# Patient Record
Sex: Female | Born: 1945
Health system: Southern US, Community
[De-identification: ages and names within clinical notes are randomized; demographics above are authoritative.]

## PROBLEM LIST (undated history)

## (undated) DIAGNOSIS — M419 Scoliosis, unspecified: Secondary | ICD-10-CM

## (undated) DIAGNOSIS — E785 Hyperlipidemia, unspecified: Secondary | ICD-10-CM

## (undated) DIAGNOSIS — J302 Other seasonal allergic rhinitis: Secondary | ICD-10-CM

## (undated) HISTORY — PX: TONSILLECTOMY: SUR1361

## (undated) HISTORY — DX: Other seasonal allergic rhinitis: J30.2

## (undated) HISTORY — DX: Hyperlipidemia, unspecified: E78.5

## (undated) HISTORY — PX: BREAST BIOPSY: SHX20

## (undated) HISTORY — DX: Scoliosis, unspecified: M41.9

---

## 1971-10-22 HISTORY — PX: NASAL SEPTUM SURGERY: SHX37

## 1978-10-21 HISTORY — PX: DILATION AND CURETTAGE OF UTERUS: SHX78

## 1998-01-12 ENCOUNTER — Ambulatory Visit (HOSPITAL_COMMUNITY): Admission: RE | Admit: 1998-01-12 | Discharge: 1998-01-12 | Payer: Self-pay | Admitting: *Deleted

## 1998-08-20 ENCOUNTER — Emergency Department (HOSPITAL_COMMUNITY): Admission: EM | Admit: 1998-08-20 | Discharge: 1998-08-21 | Payer: Self-pay | Admitting: Emergency Medicine

## 1998-08-20 ENCOUNTER — Encounter: Payer: Self-pay | Admitting: Emergency Medicine

## 1999-01-05 ENCOUNTER — Ambulatory Visit (HOSPITAL_COMMUNITY): Admission: RE | Admit: 1999-01-05 | Discharge: 1999-01-05 | Payer: Self-pay | Admitting: *Deleted

## 1999-01-05 ENCOUNTER — Encounter: Payer: Self-pay | Admitting: *Deleted

## 1999-03-08 ENCOUNTER — Ambulatory Visit: Admission: RE | Admit: 1999-03-08 | Discharge: 1999-03-08 | Payer: Self-pay | Admitting: *Deleted

## 1999-06-29 ENCOUNTER — Ambulatory Visit (HOSPITAL_COMMUNITY): Admission: RE | Admit: 1999-06-29 | Discharge: 1999-06-29 | Payer: Self-pay | Admitting: *Deleted

## 2000-03-07 ENCOUNTER — Encounter: Admission: RE | Admit: 2000-03-07 | Discharge: 2000-03-07 | Payer: Self-pay | Admitting: *Deleted

## 2000-03-07 ENCOUNTER — Encounter: Payer: Self-pay | Admitting: *Deleted

## 2000-11-07 ENCOUNTER — Encounter: Payer: Self-pay | Admitting: Emergency Medicine

## 2000-11-07 ENCOUNTER — Emergency Department (HOSPITAL_COMMUNITY): Admission: EM | Admit: 2000-11-07 | Discharge: 2000-11-07 | Payer: Self-pay | Admitting: *Deleted

## 2001-03-13 ENCOUNTER — Encounter: Payer: Self-pay | Admitting: *Deleted

## 2001-03-13 ENCOUNTER — Ambulatory Visit (HOSPITAL_COMMUNITY): Admission: RE | Admit: 2001-03-13 | Discharge: 2001-03-13 | Payer: Self-pay | Admitting: *Deleted

## 2001-04-17 ENCOUNTER — Other Ambulatory Visit: Admission: RE | Admit: 2001-04-17 | Discharge: 2001-04-17 | Payer: Self-pay | Admitting: *Deleted

## 2001-06-26 ENCOUNTER — Ambulatory Visit (HOSPITAL_COMMUNITY): Admission: RE | Admit: 2001-06-26 | Discharge: 2001-06-26 | Payer: Self-pay | Admitting: Gastroenterology

## 2006-11-19 ENCOUNTER — Ambulatory Visit (HOSPITAL_COMMUNITY): Admission: RE | Admit: 2006-11-19 | Discharge: 2006-11-19 | Payer: Self-pay | Admitting: Endocrinology

## 2008-12-28 ENCOUNTER — Ambulatory Visit (HOSPITAL_COMMUNITY): Admission: RE | Admit: 2008-12-28 | Discharge: 2008-12-28 | Payer: Self-pay | Admitting: Endocrinology

## 2009-09-08 ENCOUNTER — Encounter: Admission: RE | Admit: 2009-09-08 | Discharge: 2009-09-08 | Payer: Self-pay | Admitting: Endocrinology

## 2010-03-14 ENCOUNTER — Encounter: Admission: RE | Admit: 2010-03-14 | Discharge: 2010-03-14 | Payer: Self-pay | Admitting: Rheumatology

## 2010-07-10 ENCOUNTER — Ambulatory Visit (HOSPITAL_COMMUNITY): Admission: RE | Admit: 2010-07-10 | Discharge: 2010-07-10 | Payer: Self-pay | Admitting: Endocrinology

## 2010-08-17 ENCOUNTER — Ambulatory Visit (HOSPITAL_COMMUNITY): Admission: RE | Admit: 2010-08-17 | Discharge: 2010-08-17 | Payer: Self-pay | Admitting: Obstetrics and Gynecology

## 2010-08-24 ENCOUNTER — Encounter: Admission: RE | Admit: 2010-08-24 | Discharge: 2010-08-24 | Payer: Self-pay | Admitting: General Surgery

## 2011-03-07 ENCOUNTER — Encounter (INDEPENDENT_AMBULATORY_CARE_PROVIDER_SITE_OTHER): Payer: Self-pay | Admitting: General Surgery

## 2011-03-08 NOTE — Procedures (Signed)
[place]  Patient:    Haley Kirk, Haley Kirk Visit Number: [id number] MRN: [id number]          Service Type: END Location: ENDO Attending Physician:  Charna Elizabeth Dictated by:   Anselmo Rod, M.D. Proc. Date: 06/26/01 Admit Date:  06/26/2001   CC:         Bernadene Person, M.D.   Procedure Report  DATE OF BIRTH:  10-Apr-1946.  PROCEDURE:  Colonoscopy.  INDICATION FOR PROCEDURE:  Screening colonoscopy being performed in a 66 year old white female.  Patient has a history of occasional BRBPR and a history of adenomatous polyps in her brother.  PREPROCEDURE PREPARATION:  Informed consent was procured from the patient. The patient was fasted for eight hours prior to the procedure and prepped with a bottle of magnesium citrate and a gallon of NuLytely the night prior to the procedure.  PREPROCEDURE PHYSICAL:  VITAL SIGNS:  The patient had stable vital signs.  NECK:  Supple.  CHEST:  Clear to auscultation.  S1, S2 regular.  ABDOMEN:  Soft with normal bowel sounds.  DESCRIPTION OF PROCEDURE:  The patient was placed in the left lateral decubitus position and sedated with 50 mg of Demerol and 7 mg of Versed intravenously.  Once the patient was adequately sedate and maintained on low-flow oxygen and continuous cardiac monitoring, the Olympus video colonoscope was advanced from the rectum to the cecum without difficulty. The patient had a fairly good prep.  No masses, polyps, erosions, ulcerations, or diverticula were seen.  The entire colonic mucosa appeared healthy and without lesions.  Small internal hemorrhoids were appreciated on retroflexion in the rectum.  The patient tolerated the procedure well without complications.  IMPRESSION:  Healthy-appearing colon except for small, nonbleeding internal hemorrhoids.  RECOMMENDATIONS: 1. The patient has been advised to have repeat colorectal cancer screening in    the next five years unless  she were to develop any abnormal symptoms in the    interim. 2. Outpatient follow-up is advised as the need arises. Dictated by:   Anselmo Rod, M.D. Attending Physician:  Charna Elizabeth DD:  06/26/01 TD:  06/27/01 Job: [id number]

## 2011-08-15 ENCOUNTER — Telehealth (INDEPENDENT_AMBULATORY_CARE_PROVIDER_SITE_OTHER): Payer: Self-pay

## 2011-08-15 NOTE — Telephone Encounter (Signed)
Called to pt to schedule her yearly follow up with gallbladder US.  The pt will call us back when she is ready to schedule.

## 2012-02-10 ENCOUNTER — Other Ambulatory Visit (HOSPITAL_COMMUNITY): Payer: Self-pay | Admitting: Obstetrics & Gynecology

## 2012-02-10 DIAGNOSIS — Z1231 Encounter for screening mammogram for malignant neoplasm of breast: Secondary | ICD-10-CM

## 2012-03-05 ENCOUNTER — Ambulatory Visit (HOSPITAL_COMMUNITY): Admission: RE | Admit: 2012-03-05 | Payer: Self-pay | Source: Ambulatory Visit

## 2012-03-25 ENCOUNTER — Ambulatory Visit (HOSPITAL_COMMUNITY)
Admission: RE | Admit: 2012-03-25 | Discharge: 2012-03-25 | Disposition: A | Discharge status: Home / Self Care | Payer: 59 | Source: Ambulatory Visit | Attending: Obstetrics & Gynecology | Admitting: Obstetrics & Gynecology

## 2012-03-25 DIAGNOSIS — Z1231 Encounter for screening mammogram for malignant neoplasm of breast: Secondary | ICD-10-CM | POA: Insufficient documentation

## 2013-10-28 HISTORY — PX: IRRIGATION AND DEBRIDEMENT SEBACEOUS CYST: SHX5255

## 2014-09-02 ENCOUNTER — Telehealth (INDEPENDENT_AMBULATORY_CARE_PROVIDER_SITE_OTHER): Payer: Self-pay

## 2014-09-02 ENCOUNTER — Other Ambulatory Visit (INDEPENDENT_AMBULATORY_CARE_PROVIDER_SITE_OTHER): Payer: Self-pay

## 2014-09-02 DIAGNOSIS — K824 Cholesterolosis of gallbladder: Secondary | ICD-10-CM

## 2014-09-02 NOTE — Telephone Encounter (Signed)
Per Dr Marlowe Aschoff request a limited ruq usn ordered to follow hx gallbladder polyp. Encounter to ref coord to set up and call pt.

## 2014-09-05 ENCOUNTER — Other Ambulatory Visit: Payer: 59

## 2014-09-09 ENCOUNTER — Other Ambulatory Visit: Payer: 59

## 2014-09-22 ENCOUNTER — Ambulatory Visit
Admission: RE | Admit: 2014-09-22 | Discharge: 2014-09-22 | Disposition: A | Discharge status: Home / Self Care | Payer: BC Managed Care – PPO | Source: Ambulatory Visit | Attending: General Surgery | Admitting: General Surgery

## 2014-09-22 DIAGNOSIS — K824 Cholesterolosis of gallbladder: Secondary | ICD-10-CM

## 2014-09-23 ENCOUNTER — Telehealth (INDEPENDENT_AMBULATORY_CARE_PROVIDER_SITE_OTHER): Payer: Self-pay

## 2014-09-23 NOTE — Telephone Encounter (Signed)
Pt informed of results and that she will not need another [place].

## 2014-11-03 ENCOUNTER — Ambulatory Visit (HOSPITAL_COMMUNITY)
Admission: RE | Admit: 2014-11-03 | Discharge: 2014-11-03 | Disposition: A | Discharge status: Home / Self Care | Payer: BLUE CROSS/BLUE SHIELD | Source: Ambulatory Visit | Attending: Obstetrics & Gynecology | Admitting: Obstetrics & Gynecology

## 2014-11-03 ENCOUNTER — Other Ambulatory Visit (HOSPITAL_COMMUNITY): Payer: Self-pay | Admitting: Obstetrics & Gynecology

## 2014-11-03 DIAGNOSIS — Z1231 Encounter for screening mammogram for malignant neoplasm of breast: Secondary | ICD-10-CM | POA: Insufficient documentation

## 2014-11-09 ENCOUNTER — Encounter: Payer: Self-pay | Admitting: Pulmonary Disease

## 2014-11-09 ENCOUNTER — Ambulatory Visit (INDEPENDENT_AMBULATORY_CARE_PROVIDER_SITE_OTHER): Payer: BLUE CROSS/BLUE SHIELD | Admitting: Pulmonary Disease

## 2014-11-09 VITALS — BP 120/78 | HR 93 | Temp 98.5°F | Ht 66.0 in | Wt 155.6 lb

## 2014-11-09 DIAGNOSIS — R058 Other specified cough: Secondary | ICD-10-CM

## 2014-11-09 DIAGNOSIS — R053 Chronic cough: Secondary | ICD-10-CM

## 2014-11-09 DIAGNOSIS — R05 Cough: Secondary | ICD-10-CM

## 2014-11-09 DIAGNOSIS — J45901 Unspecified asthma with (acute) exacerbation: Secondary | ICD-10-CM

## 2014-11-09 DIAGNOSIS — J309 Allergic rhinitis, unspecified: Secondary | ICD-10-CM

## 2014-11-09 MED ORDER — PREDNISONE 10 MG PO TABS: ORAL_TABLET | ORAL | Status: DC

## 2014-11-09 MED ORDER — TRIAMCINOLONE ACETONIDE 55 MCG/ACT NA AERO: 2.0000 | INHALATION_SPRAY | Freq: Every day | NASAL | Status: DC

## 2014-11-09 MED ORDER — ALBUTEROL SULFATE (2.5 MG/3ML) 0.083% IN NEBU
2.5000 mg | INHALATION_SOLUTION | Freq: Once | RESPIRATORY_TRACT | Status: AC
  Administered 2014-11-09: 2.5 mg via RESPIRATORY_TRACT

## 2014-11-09 MED ORDER — FLUTICASONE FUROATE-VILANTEROL 100-25 MCG/INH IN AEPB: 1.0000 | INHALATION_SPRAY | Freq: Every day | RESPIRATORY_TRACT | Status: DC

## 2014-11-09 NOTE — Progress Notes (Deleted)
   Subjective:    Patient ID: Haley Kirk, female    DOB: Sep 23, 1946, 69 y.o.   MRN: [id number]  HPI    Review of Systems  Constitutional: Negative for fever and unexpected weight change.  HENT: Positive for congestion ( chest - unable to expel). Negative for dental problem, ear pain, nosebleeds, postnasal drip, rhinorrhea, sinus pressure, sneezing, sore throat and trouble swallowing.   Eyes: Negative for redness and itching.  Respiratory: Positive for cough and wheezing. Negative for chest tightness and shortness of breath.   Cardiovascular: Negative for palpitations and leg swelling.  Gastrointestinal: Negative for nausea and vomiting.  Genitourinary: Negative for dysuria.  Musculoskeletal: Negative for joint swelling.  Skin: Negative for rash.  Neurological: Negative for headaches.  Hematological: Does not bruise/bleed easily.  Psychiatric/Behavioral: Negative for dysphoric mood. The patient is not nervous/anxious.        Objective:   Physical Exam        Assessment & Plan:

## 2014-11-09 NOTE — Patient Instructions (Signed)
Prednisone 10 mg pill >> 2 pills daily for 2 days, 1 pill daily for 2 days, 1/2 pill daily for 2 days Breo one puff daily >> rinse mouth after each use Nasal irrigation (saline sinus rinse) daily Nasacort two sprays each nostril daily Will schedule PFT (breathing test) Follow up with Dr. Halford Chessman or Tammy Parrett in 2 weeks

## 2014-11-09 NOTE — Progress Notes (Signed)
Chief Complaint  Patient presents with  . PULMONARY CONSULT    Referred by Dr Wilson Singer for persistent cough. Recent cxr.     History of Present Illness: Haley Kirk is a 69 y.o. female never smoker for evaluation of chronic cough.  Her cough started about 4 weeks ago.  She thinks she got a cold 4 weeks ago, and this triggered her cough to get worse.  She has mild symptoms otherwise, but has intermittent cough.  She thinks she has allergies.  She always feels congested, but her symptoms get worse in the Fall.  She will get itch throat and sneezing in the Fall.  Her skin will also feel itchy, but she denies hives.  She has never had allergy testing.  She has taken zyrtec >> not helping as much as usual.  She previously lived in [place], [place], and [place] >> she didn't really get allergy symptoms until she moved to [place].  She denies any prior hx of asthma.  She did try albuterol >> this helped.  Her husband is a physician >> she got steroid injection last week, and this seems to have helped also.  She was also given cefuroxime.  She has not tried any nasal sprays.  She has not had PFTs before.  She had repair of nasal septal deviation in 1973.  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  She does not have any pets, but denies any difficulty with allergies around animals.  There is no hx of aspirin sensitivity or food allergies.  She is not aware of any medication allergies.  She gets occasional episodes of reflux.  She has tried nexium, but this didn't seem to help with her cough much.  She only took nexium on limited basis.  CXR from 11/04/14 >> kyphoscoliosis.   Haley Kirk  has a past medical history of Hyperlipidemia; Seasonal allergies; and Scoliosis.  Haley Kirk  has past surgical history that includes Breast biopsy (20 years ago); Tonsillectomy (69 years of age); Dilation and curettage of uterus (1980); Nasal septum surgery (1973); and  Irrigation and debridement sabaceous cyst (10/28/2013).  Prior to Admission medications   Medication Sig Start Date End Date Taking? Authorizing Provider  cefUROXime (CEFTIN) 500 MG tablet Take 500 mg by mouth 2 (two) times daily with a meal.   Yes Historical Provider, MD  cetirizine (ZYRTEC) 10 MG tablet Take 10 mg by mouth daily.   Yes Historical Provider, MD  esomeprazole (NEXIUM) 40 MG capsule Take 40 mg by mouth as needed.   Yes Historical Provider, MD  Omeprazole-Sodium Bicarbonate (ZEGERID OTC PO) Take by mouth as needed.   Yes Historical Provider, MD    No Known Allergies  Her family history includes Diabetes in her brother, mother, sister, and sister; Heart disease in her mother; Hypertension in her mother and sister; Kidney disease in her mother.  She  reports that she has never smoked. She does not have any smokeless tobacco history on file. She reports that she drinks alcohol. She reports that she does not use illicit drugs.  Review of Systems  Constitutional: Negative for fever and unexpected weight change.  HENT: Positive for congestion ( chest - unable to expel). Negative for dental problem, ear pain, nosebleeds, postnasal drip, rhinorrhea, sinus pressure, sneezing, sore throat and trouble swallowing.   Eyes: Negative for redness and itching.  Respiratory: Positive for cough and wheezing. Negative for chest tightness and shortness of breath.   Cardiovascular:  Negative for palpitations and leg swelling.  Gastrointestinal: Negative for nausea and vomiting.  Genitourinary: Negative for dysuria.  Musculoskeletal: Negative for joint swelling.  Skin: Negative for rash.  Neurological: Negative for headaches.  Hematological: Does not bruise/bleed easily.  Psychiatric/Behavioral: Negative for dysphoric mood. The patient is not nervous/anxious.    Physical Exam: Blood pressure 120/78, pulse 93, temperature 98.5 F (36.9 C), temperature source Oral, height 5\' 6"  (1.676 m), weight  155 lb 9.6 oz (70.58 kg), SpO2 96 %. Body mass index is 25.13 kg/(m^2).  General - No distress ENT - No sinus tenderness, no oral exudate, no LAN, no thyromegaly, TM clear, pupils equal/reactive Cardiac - s1s2 regular, no murmur, pulses symmetric Chest - No wheeze/rales/dullness, good air entry, normal respiratory excursion Back - No focal tenderness Abd - Soft, non-tender, no organomegaly, + bowel sounds Ext - No edema Neuro - Normal strength, cranial nerves intact Skin - No rashes Psych - Normal mood, and behavior  Discussion:  She has chronic cough.  There is no history of smoking, and her chest xray was relatively unremarkable.  In this scenario, the most likely causes of chronic cough are post-nasal drip, asthma, and/or reflux.  She appears to have chronic allergic rhinitis and post-nasal drip.  Her symptoms likely became worse over the past few weeks after upper respiratory infection.  After this she appears to have developed asthmatic bronchitis.  I am less concerned about reflux contributing to her symptoms at this time.  Assessment/Plan:  Asthmatic bronchitis. Plan: - will have her finish course of cefuroxime - will give her tapering course of prednisone - will have her start Breo one puff daily >> advised to rinse mouth after each use - she can use albuterol as needed - will arrange for PFT's to further assess  Allergic rhinitis with post-nasal drip causing upper airway cough syndrome. Plan: - she is to use nasal irrigation and nasacort daily until f/u visit - she can continue zyrtec as needed - defer further allergy testing for now  Intermittent reflux. Does not seem to be a prominent issue at present. Plan: - she can use prn OTC PPI  Chronic cough. Plan: - she can use OTC delsym or mucinex prn    Chesley Mires, MD [place] [facility] Pager:  [phone]

## 2014-11-17 ENCOUNTER — Ambulatory Visit (HOSPITAL_COMMUNITY)
Admission: RE | Admit: 2014-11-17 | Discharge: 2014-11-17 | Disposition: A | Discharge status: Home / Self Care | Payer: BLUE CROSS/BLUE SHIELD | Source: Ambulatory Visit | Attending: Pulmonary Disease | Admitting: Pulmonary Disease

## 2014-11-17 DIAGNOSIS — R05 Cough: Secondary | ICD-10-CM | POA: Diagnosis not present

## 2014-11-17 DIAGNOSIS — R053 Chronic cough: Secondary | ICD-10-CM

## 2014-11-17 LAB — PULMONARY FUNCTION TEST
DL/VA % pred: 96 %
DL/VA: 4.77 ml/min/mmHg/L
DLCO COR % PRED: 94 %
DLCO COR: 24.31 ml/min/mmHg
DLCO unc % pred: 94 %
DLCO unc: 24.31 ml/min/mmHg
FEF 25-75 PRE: 3.24 L/s
FEF 25-75 Post: 2.76 L/sec
FEF2575-%Change-Post: -14 %
FEF2575-%Pred-Post: 133 %
FEF2575-%Pred-Pre: 156 %
FEV1-%CHANGE-POST: -1 %
FEV1-%PRED-POST: 115 %
FEV1-%PRED-PRE: 117 %
FEV1-Post: 2.81 L
FEV1-Pre: 2.85 L
FEV1FVC-%CHANGE-POST: 3 %
FEV1FVC-%Pred-Pre: 107 %
FEV6-%CHANGE-POST: -4 %
FEV6-%PRED-POST: 109 %
FEV6-%Pred-Pre: 114 %
FEV6-Post: 3.35 L
FEV6-Pre: 3.5 L
FEV6FVC-%Change-Post: 0 %
FEV6FVC-%PRED-POST: 104 %
FEV6FVC-%PRED-PRE: 103 %
FVC-%Change-Post: -4 %
FVC-%PRED-POST: 104 %
FVC-%PRED-PRE: 109 %
FVC-PRE: 3.51 L
FVC-Post: 3.35 L
POST FEV1/FVC RATIO: 84 %
Post FEV6/FVC ratio: 100 %
Pre FEV1/FVC ratio: 81 %
Pre FEV6/FVC Ratio: 100 %
RV % pred: 82 %
RV: 1.8 L
TLC % pred: 104 %
TLC: 5.43 L

## 2014-11-17 MED ORDER — ALBUTEROL SULFATE (2.5 MG/3ML) 0.083% IN NEBU
2.5000 mg | INHALATION_SOLUTION | Freq: Once | RESPIRATORY_TRACT | Status: AC
  Administered 2014-11-17: 2.5 mg via RESPIRATORY_TRACT

## 2014-11-18 ENCOUNTER — Encounter: Payer: Self-pay | Admitting: Pulmonary Disease

## 2014-11-18 ENCOUNTER — Telehealth: Payer: Self-pay | Admitting: Pulmonary Disease

## 2014-11-18 NOTE — Telephone Encounter (Signed)
PFT 11/17/14 >> FEV1 2.85 (117%), FEV1% 81, TLC 5.43 (104%), DLCO 94%, no BD  Will have my nurse inform pt that PFT was normal.

## 2014-11-18 NOTE — Telephone Encounter (Signed)
lmtcb

## 2014-11-21 NOTE — Telephone Encounter (Signed)
lmtcb x2 

## 2014-11-21 NOTE — Telephone Encounter (Signed)
Pt is aware of results. 

## 2014-11-21 NOTE — Telephone Encounter (Signed)
Pt returning call for results.Haley Kirk ° °

## 2014-11-24 ENCOUNTER — Encounter: Payer: Self-pay | Admitting: Adult Health

## 2014-11-24 ENCOUNTER — Ambulatory Visit (INDEPENDENT_AMBULATORY_CARE_PROVIDER_SITE_OTHER): Payer: BLUE CROSS/BLUE SHIELD | Admitting: Adult Health

## 2014-11-24 VITALS — BP 128/82 | HR 66 | Temp 99.2°F | Ht 66.0 in | Wt 157.0 lb

## 2014-11-24 DIAGNOSIS — R05 Cough: Secondary | ICD-10-CM

## 2014-11-24 DIAGNOSIS — R053 Chronic cough: Secondary | ICD-10-CM | POA: Insufficient documentation

## 2014-11-24 NOTE — Assessment & Plan Note (Signed)
Cyclical cough triggered by AR , post nasal drip , need to control cough and triggers.  No sign of Airflow obstruction on PFT , may stop BREO  Plan   Take Zyrtec 10mg  daily in am  May use Chlorpheneramine 4mg  2 At bedtime  As needed  Drainage  Continue on Nexium 40mg  daily  Delsym 2 tsp Twice daily  As needed  Cough  Sips of water to avoid cough and throat clearing.  Avoid MINT products .  [employer] , call if symptoms get worse off.  Follow up Dr. Halford Chessman  In 2-3 months and As needed   Please contact office for sooner follow up if symptoms do not improve or worsen or seek emergency care '

## 2014-11-24 NOTE — Progress Notes (Signed)
Reviewed and agree with assessment/plan. 

## 2014-11-24 NOTE — Patient Instructions (Signed)
Take Zyrtec 10mg  daily in am  May use Chlorpheneramine 4mg  2 At bedtime  As needed  Drainage  Continue on Nexium 40mg  daily  Delsym 2 tsp Twice daily  As needed  Cough  Sips of water to avoid cough and throat clearing.  Avoid MINT products .  [employer] , call if symptoms get worse off.  Follow up Dr. Halford Chessman  In 2-3 months and As needed   Please contact office for sooner follow up if symptoms do not improve or worsen or seek emergency care '

## 2014-11-24 NOTE — Progress Notes (Signed)
   Subjective:    Patient ID: Haley Kirk, female    DOB: 1945-11-18, 69 y.o.   MRN: [id number]  HPI 69 yo never smoker for evaluation of chronic cough seen for pulmonary consult on 11/09/14   11/24/2014 Follow up : Cough and Test Results/PFT  Patient returns for a two-week follow-up. She was seen last visit for a primary consultation for chronic cough. Chest x-ray from January 15 showed no acute process and kyphoscoliosis. Cough was felt to be secondary to postnasal drip. She had been treated with antibiotics steroid injection without much relief She was started on a prednisone taper, BREO, Zyrtec, Nasacort. Patient underwent a PFT on 11/17/2014, that showed normal pulmonary function FEV1 was 117%, ratio 81, FVC 109%, no significant bronchodilator response, normal diffusing capacity. Reports cough is much improved, near baseline (only coughing to loosen mucus).   Still has drainage in throat with constant sensation to clear throat.  She denies any chest pain, dyspnea, hemoptysis, unintentional weight loss, nausea, vomiting or diarrhea.  Review of Systems Constitutional:   No  weight loss, night sweats,  Fevers, chills, fatigue, or  lassitude.  HEENT:   No headaches,  Difficulty swallowing,  Tooth/dental problems, or  Sore throat,                No sneezing, itching, ear ache, nasal congestion, post nasal drip,   CV:  No chest pain,  Orthopnea, PND, swelling in lower extremities, anasarca, dizziness, palpitations, syncope.   GI  No heartburn, indigestion, abdominal pain, nausea, vomiting, diarrhea, change in bowel habits, loss of appetite, bloody stools.   Resp:    No chest wall deformity  Skin: no rash or lesions.  GU: no dysuria, change in color of urine, no urgency or frequency.  No flank pain, no hematuria   MS:  No joint pain or swelling.  No decreased range of motion.  No back pain.  Psych:  No change in mood or affect. No depression or anxiety.  No memory loss.         Objective:   Physical Exam GEN: A/Ox3; pleasant , NAD, well nourished   HEENT:  [place]/AT,  EACs-clear, TMs-wnl, NOSE-clear, THROAT-clear, no lesions, no postnasal drip or exudate noted.   NECK:  Supple w/ fair ROM; no JVD; normal carotid impulses w/o bruits; no thyromegaly or nodules palpated; no lymphadenopathy.  RESP  Clear  P & A; w/o, wheezes/ rales/ or rhonchi.no accessory muscle use, no dullness to percussion  CARD:  RRR, no m/r/g  , no peripheral edema, pulses intact, no cyanosis or clubbing.  GI:   Soft & nt; nml bowel sounds; no organomegaly or masses detected.  Musco: Warm bil, no deformities or joint swelling noted.   Neuro: alert, no focal deficits noted.    Skin: Warm, no lesions or rashes         Assessment & Plan:

## 2016-05-15 ENCOUNTER — Other Ambulatory Visit: Payer: Self-pay | Admitting: Obstetrics & Gynecology

## 2016-05-15 DIAGNOSIS — Z1231 Encounter for screening mammogram for malignant neoplasm of breast: Secondary | ICD-10-CM

## 2016-05-27 ENCOUNTER — Ambulatory Visit
Admission: RE | Admit: 2016-05-27 | Discharge: 2016-05-27 | Disposition: A | Discharge status: Home / Self Care | Payer: BLUE CROSS/BLUE SHIELD | Source: Ambulatory Visit | Attending: Obstetrics & Gynecology | Admitting: Obstetrics & Gynecology

## 2016-05-27 DIAGNOSIS — Z1231 Encounter for screening mammogram for malignant neoplasm of breast: Secondary | ICD-10-CM

## 2017-04-02 ENCOUNTER — Other Ambulatory Visit: Payer: Self-pay | Admitting: Endocrinology

## 2017-04-02 DIAGNOSIS — R109 Unspecified abdominal pain: Secondary | ICD-10-CM

## 2017-04-11 ENCOUNTER — Ambulatory Visit
Admission: RE | Admit: 2017-04-11 | Discharge: 2017-04-11 | Disposition: A | Discharge status: Home / Self Care | Payer: BLUE CROSS/BLUE SHIELD | Source: Ambulatory Visit | Attending: Endocrinology | Admitting: Endocrinology

## 2017-04-11 DIAGNOSIS — R109 Unspecified abdominal pain: Secondary | ICD-10-CM

## 2017-09-24 DIAGNOSIS — K219 Gastro-esophageal reflux disease without esophagitis: Secondary | ICD-10-CM | POA: Diagnosis not present

## 2017-09-24 DIAGNOSIS — R140 Abdominal distension (gaseous): Secondary | ICD-10-CM | POA: Diagnosis not present

## 2018-07-23 DIAGNOSIS — H02413 Mechanical ptosis of bilateral eyelids: Secondary | ICD-10-CM | POA: Diagnosis not present

## 2018-07-30 DIAGNOSIS — Z01818 Encounter for other preprocedural examination: Secondary | ICD-10-CM | POA: Diagnosis not present

## 2018-07-30 DIAGNOSIS — L908 Other atrophic disorders of skin: Secondary | ICD-10-CM | POA: Diagnosis not present

## 2018-07-30 DIAGNOSIS — H02413 Mechanical ptosis of bilateral eyelids: Secondary | ICD-10-CM | POA: Diagnosis not present

## 2018-08-07 DIAGNOSIS — R5383 Other fatigue: Secondary | ICD-10-CM | POA: Diagnosis not present

## 2018-08-07 DIAGNOSIS — Z131 Encounter for screening for diabetes mellitus: Secondary | ICD-10-CM | POA: Diagnosis not present

## 2018-08-07 DIAGNOSIS — E789 Disorder of lipoprotein metabolism, unspecified: Secondary | ICD-10-CM | POA: Diagnosis not present

## 2018-08-07 DIAGNOSIS — R739 Hyperglycemia, unspecified: Secondary | ICD-10-CM | POA: Diagnosis not present

## 2018-08-19 DIAGNOSIS — H02412 Mechanical ptosis of left eyelid: Secondary | ICD-10-CM | POA: Diagnosis not present

## 2018-08-19 DIAGNOSIS — H02403 Unspecified ptosis of bilateral eyelids: Secondary | ICD-10-CM | POA: Diagnosis not present

## 2018-08-19 DIAGNOSIS — H02413 Mechanical ptosis of bilateral eyelids: Secondary | ICD-10-CM | POA: Diagnosis not present

## 2018-08-19 DIAGNOSIS — H02411 Mechanical ptosis of right eyelid: Secondary | ICD-10-CM | POA: Diagnosis not present

## 2018-10-09 ENCOUNTER — Other Ambulatory Visit: Payer: Self-pay | Admitting: Internal Medicine

## 2018-10-09 DIAGNOSIS — Z01419 Encounter for gynecological examination (general) (routine) without abnormal findings: Secondary | ICD-10-CM | POA: Diagnosis not present

## 2018-10-09 DIAGNOSIS — M25441 Effusion, right hand: Secondary | ICD-10-CM | POA: Diagnosis not present

## 2018-10-09 DIAGNOSIS — Z1231 Encounter for screening mammogram for malignant neoplasm of breast: Secondary | ICD-10-CM

## 2018-10-09 DIAGNOSIS — K824 Cholesterolosis of gallbladder: Secondary | ICD-10-CM | POA: Diagnosis not present

## 2018-10-09 DIAGNOSIS — G629 Polyneuropathy, unspecified: Secondary | ICD-10-CM | POA: Diagnosis not present

## 2018-10-09 DIAGNOSIS — Z1212 Encounter for screening for malignant neoplasm of rectum: Secondary | ICD-10-CM | POA: Diagnosis not present

## 2018-10-09 DIAGNOSIS — Z0000 Encounter for general adult medical examination without abnormal findings: Secondary | ICD-10-CM | POA: Diagnosis not present

## 2018-10-09 DIAGNOSIS — M7989 Other specified soft tissue disorders: Secondary | ICD-10-CM | POA: Diagnosis not present

## 2018-10-09 DIAGNOSIS — N814 Uterovaginal prolapse, unspecified: Secondary | ICD-10-CM | POA: Diagnosis not present

## 2018-10-09 DIAGNOSIS — M79641 Pain in right hand: Secondary | ICD-10-CM | POA: Diagnosis not present

## 2018-10-09 DIAGNOSIS — E7800 Pure hypercholesterolemia, unspecified: Secondary | ICD-10-CM | POA: Diagnosis not present

## 2018-10-23 ENCOUNTER — Other Ambulatory Visit: Payer: Self-pay | Admitting: Internal Medicine

## 2018-10-23 DIAGNOSIS — K824 Cholesterolosis of gallbladder: Secondary | ICD-10-CM

## 2018-10-30 DIAGNOSIS — M25441 Effusion, right hand: Secondary | ICD-10-CM | POA: Diagnosis not present

## 2018-10-30 DIAGNOSIS — R202 Paresthesia of skin: Secondary | ICD-10-CM | POA: Diagnosis not present

## 2018-10-30 DIAGNOSIS — E7800 Pure hypercholesterolemia, unspecified: Secondary | ICD-10-CM | POA: Diagnosis not present

## 2018-10-30 DIAGNOSIS — E559 Vitamin D deficiency, unspecified: Secondary | ICD-10-CM | POA: Diagnosis not present

## 2018-10-30 DIAGNOSIS — G629 Polyneuropathy, unspecified: Secondary | ICD-10-CM | POA: Diagnosis not present

## 2018-11-06 ENCOUNTER — Ambulatory Visit
Admission: RE | Admit: 2018-11-06 | Discharge: 2018-11-06 | Disposition: A | Discharge status: Home / Self Care | Payer: Medicare Other | Source: Ambulatory Visit | Attending: Internal Medicine | Admitting: Internal Medicine

## 2018-11-06 ENCOUNTER — Other Ambulatory Visit: Payer: Self-pay | Admitting: Internal Medicine

## 2018-11-06 ENCOUNTER — Other Ambulatory Visit: Payer: BLUE CROSS/BLUE SHIELD

## 2018-11-06 DIAGNOSIS — E7800 Pure hypercholesterolemia, unspecified: Secondary | ICD-10-CM | POA: Diagnosis not present

## 2018-11-06 DIAGNOSIS — M064 Inflammatory polyarthropathy: Secondary | ICD-10-CM | POA: Diagnosis not present

## 2018-11-06 DIAGNOSIS — M1990 Unspecified osteoarthritis, unspecified site: Secondary | ICD-10-CM | POA: Diagnosis not present

## 2018-11-06 DIAGNOSIS — K824 Cholesterolosis of gallbladder: Secondary | ICD-10-CM

## 2018-11-06 DIAGNOSIS — E559 Vitamin D deficiency, unspecified: Secondary | ICD-10-CM | POA: Diagnosis not present

## 2018-11-06 DIAGNOSIS — K760 Fatty (change of) liver, not elsewhere classified: Secondary | ICD-10-CM | POA: Diagnosis not present

## 2018-11-06 DIAGNOSIS — M79643 Pain in unspecified hand: Secondary | ICD-10-CM | POA: Diagnosis not present

## 2018-11-23 ENCOUNTER — Ambulatory Visit
Admission: RE | Admit: 2018-11-23 | Discharge: 2018-11-23 | Disposition: A | Discharge status: Home / Self Care | Payer: Medicare Other | Source: Ambulatory Visit | Attending: Internal Medicine | Admitting: Internal Medicine

## 2018-11-23 DIAGNOSIS — Z1231 Encounter for screening mammogram for malignant neoplasm of breast: Secondary | ICD-10-CM

## 2018-12-16 DIAGNOSIS — N8111 Cystocele, midline: Secondary | ICD-10-CM | POA: Diagnosis not present

## 2018-12-16 DIAGNOSIS — N393 Stress incontinence (female) (male): Secondary | ICD-10-CM | POA: Diagnosis not present

## 2018-12-16 DIAGNOSIS — M952 Other acquired deformity of head: Secondary | ICD-10-CM | POA: Diagnosis not present

## 2018-12-16 DIAGNOSIS — N814 Uterovaginal prolapse, unspecified: Secondary | ICD-10-CM | POA: Diagnosis not present

## 2018-12-16 DIAGNOSIS — L900 Lichen sclerosus et atrophicus: Secondary | ICD-10-CM | POA: Diagnosis not present

## 2018-12-16 DIAGNOSIS — R151 Fecal smearing: Secondary | ICD-10-CM | POA: Diagnosis not present

## 2019-01-28 DIAGNOSIS — S91331A Puncture wound without foreign body, right foot, initial encounter: Secondary | ICD-10-CM | POA: Diagnosis not present

## 2019-01-28 DIAGNOSIS — Z283 Underimmunization status: Secondary | ICD-10-CM | POA: Diagnosis not present

## 2019-01-28 DIAGNOSIS — Z1389 Encounter for screening for other disorder: Secondary | ICD-10-CM | POA: Diagnosis not present

## 2019-01-28 DIAGNOSIS — Z23 Encounter for immunization: Secondary | ICD-10-CM | POA: Diagnosis not present

## 2019-01-28 DIAGNOSIS — S99921A Unspecified injury of right foot, initial encounter: Secondary | ICD-10-CM | POA: Diagnosis not present

## 2019-08-19 DIAGNOSIS — Z23 Encounter for immunization: Secondary | ICD-10-CM | POA: Diagnosis not present

## 2019-10-11 DIAGNOSIS — Z20828 Contact with and (suspected) exposure to other viral communicable diseases: Secondary | ICD-10-CM | POA: Diagnosis not present

## 2020-01-19 ENCOUNTER — Other Ambulatory Visit: Payer: Self-pay | Admitting: Internal Medicine

## 2020-01-19 DIAGNOSIS — R140 Abdominal distension (gaseous): Secondary | ICD-10-CM | POA: Diagnosis not present

## 2020-01-19 DIAGNOSIS — K219 Gastro-esophageal reflux disease without esophagitis: Secondary | ICD-10-CM | POA: Diagnosis not present

## 2020-01-19 DIAGNOSIS — Z1231 Encounter for screening mammogram for malignant neoplasm of breast: Secondary | ICD-10-CM

## 2020-01-19 DIAGNOSIS — Z86010 Personal history of colonic polyps: Secondary | ICD-10-CM | POA: Diagnosis not present

## 2020-02-01 DIAGNOSIS — Z86010 Personal history of colonic polyps: Secondary | ICD-10-CM | POA: Diagnosis not present

## 2020-02-01 DIAGNOSIS — K635 Polyp of colon: Secondary | ICD-10-CM | POA: Diagnosis not present

## 2020-02-01 DIAGNOSIS — D125 Benign neoplasm of sigmoid colon: Secondary | ICD-10-CM | POA: Diagnosis not present

## 2020-02-02 ENCOUNTER — Other Ambulatory Visit: Payer: Self-pay

## 2020-02-02 ENCOUNTER — Ambulatory Visit
Admission: RE | Admit: 2020-02-02 | Discharge: 2020-02-02 | Disposition: A | Discharge status: Home / Self Care | Payer: Medicare Other | Source: Ambulatory Visit | Attending: Internal Medicine | Admitting: Internal Medicine

## 2020-02-02 DIAGNOSIS — Z1231 Encounter for screening mammogram for malignant neoplasm of breast: Secondary | ICD-10-CM | POA: Diagnosis not present

## 2020-02-25 DIAGNOSIS — D385 Neoplasm of uncertain behavior of other respiratory organs: Secondary | ICD-10-CM | POA: Diagnosis not present

## 2020-02-25 DIAGNOSIS — D485 Neoplasm of uncertain behavior of skin: Secondary | ICD-10-CM | POA: Diagnosis not present

## 2020-02-25 DIAGNOSIS — L814 Other melanin hyperpigmentation: Secondary | ICD-10-CM | POA: Diagnosis not present

## 2020-08-03 DIAGNOSIS — L821 Other seborrheic keratosis: Secondary | ICD-10-CM | POA: Diagnosis not present

## 2020-08-03 DIAGNOSIS — L578 Other skin changes due to chronic exposure to nonionizing radiation: Secondary | ICD-10-CM | POA: Diagnosis not present

## 2020-08-03 DIAGNOSIS — B351 Tinea unguium: Secondary | ICD-10-CM | POA: Diagnosis not present

## 2020-08-03 DIAGNOSIS — D225 Melanocytic nevi of trunk: Secondary | ICD-10-CM | POA: Diagnosis not present

## 2020-08-03 DIAGNOSIS — B353 Tinea pedis: Secondary | ICD-10-CM | POA: Diagnosis not present

## 2020-09-11 DIAGNOSIS — Z23 Encounter for immunization: Secondary | ICD-10-CM | POA: Diagnosis not present

## 2020-11-29 DIAGNOSIS — J342 Deviated nasal septum: Secondary | ICD-10-CM | POA: Diagnosis not present

## 2021-01-24 DIAGNOSIS — J342 Deviated nasal septum: Secondary | ICD-10-CM | POA: Diagnosis not present

## 2021-05-29 ENCOUNTER — Other Ambulatory Visit: Payer: Self-pay | Admitting: Internal Medicine

## 2021-05-29 ENCOUNTER — Other Ambulatory Visit: Payer: Self-pay

## 2021-05-29 ENCOUNTER — Ambulatory Visit
Admission: RE | Admit: 2021-05-29 | Discharge: 2021-05-29 | Disposition: A | Discharge status: Home / Self Care | Payer: Medicare Other | Source: Ambulatory Visit | Attending: Internal Medicine | Admitting: Internal Medicine

## 2021-05-29 DIAGNOSIS — Z1231 Encounter for screening mammogram for malignant neoplasm of breast: Secondary | ICD-10-CM

## 2021-06-16 DIAGNOSIS — U071 COVID-19: Secondary | ICD-10-CM | POA: Diagnosis not present

## 2021-07-17 DIAGNOSIS — Z0000 Encounter for general adult medical examination without abnormal findings: Secondary | ICD-10-CM | POA: Diagnosis not present

## 2021-07-17 DIAGNOSIS — R739 Hyperglycemia, unspecified: Secondary | ICD-10-CM | POA: Diagnosis not present

## 2021-07-17 DIAGNOSIS — E559 Vitamin D deficiency, unspecified: Secondary | ICD-10-CM | POA: Diagnosis not present

## 2021-07-17 DIAGNOSIS — E7800 Pure hypercholesterolemia, unspecified: Secondary | ICD-10-CM | POA: Diagnosis not present

## 2021-07-24 DIAGNOSIS — K824 Cholesterolosis of gallbladder: Secondary | ICD-10-CM | POA: Diagnosis not present

## 2021-07-24 DIAGNOSIS — E7800 Pure hypercholesterolemia, unspecified: Secondary | ICD-10-CM | POA: Diagnosis not present

## 2021-07-24 DIAGNOSIS — Z0000 Encounter for general adult medical examination without abnormal findings: Secondary | ICD-10-CM | POA: Diagnosis not present

## 2021-07-24 DIAGNOSIS — E559 Vitamin D deficiency, unspecified: Secondary | ICD-10-CM | POA: Diagnosis not present

## 2021-07-24 DIAGNOSIS — D329 Benign neoplasm of meninges, unspecified: Secondary | ICD-10-CM | POA: Diagnosis not present

## 2021-09-17 DIAGNOSIS — J342 Deviated nasal septum: Secondary | ICD-10-CM | POA: Diagnosis not present

## 2021-09-21 DIAGNOSIS — E7800 Pure hypercholesterolemia, unspecified: Secondary | ICD-10-CM | POA: Diagnosis not present

## 2021-09-21 DIAGNOSIS — Z23 Encounter for immunization: Secondary | ICD-10-CM | POA: Diagnosis not present

## 2021-09-21 DIAGNOSIS — E559 Vitamin D deficiency, unspecified: Secondary | ICD-10-CM | POA: Diagnosis not present

## 2021-09-24 ENCOUNTER — Other Ambulatory Visit: Payer: Self-pay | Admitting: Registered Nurse

## 2021-09-24 DIAGNOSIS — K824 Cholesterolosis of gallbladder: Secondary | ICD-10-CM

## 2021-09-24 DIAGNOSIS — E7800 Pure hypercholesterolemia, unspecified: Secondary | ICD-10-CM

## 2021-09-24 DIAGNOSIS — D329 Benign neoplasm of meninges, unspecified: Secondary | ICD-10-CM

## 2021-10-18 ENCOUNTER — Ambulatory Visit
Admission: RE | Admit: 2021-10-18 | Discharge: 2021-10-18 | Disposition: A | Discharge status: Home / Self Care | Payer: Medicare Other | Source: Ambulatory Visit | Attending: Registered Nurse | Admitting: Registered Nurse

## 2021-10-18 ENCOUNTER — Ambulatory Visit
Admission: RE | Admit: 2021-10-18 | Discharge: 2021-10-18 | Disposition: A | Discharge status: Home / Self Care | Payer: No Typology Code available for payment source | Source: Ambulatory Visit | Attending: Registered Nurse | Admitting: Registered Nurse

## 2021-10-18 ENCOUNTER — Other Ambulatory Visit: Payer: Medicare Other

## 2021-10-18 DIAGNOSIS — E7800 Pure hypercholesterolemia, unspecified: Secondary | ICD-10-CM

## 2021-10-18 DIAGNOSIS — K824 Cholesterolosis of gallbladder: Secondary | ICD-10-CM | POA: Diagnosis not present

## 2021-10-18 DIAGNOSIS — G9389 Other specified disorders of brain: Secondary | ICD-10-CM | POA: Diagnosis not present

## 2021-10-18 DIAGNOSIS — D329 Benign neoplasm of meninges, unspecified: Secondary | ICD-10-CM

## 2021-10-18 MED ORDER — GADOBENATE DIMEGLUMINE 529 MG/ML IV SOLN
15.0000 mL | Freq: Once | INTRAVENOUS | Status: AC | PRN
  Administered 2021-10-18: 11:00:00 15 mL via INTRAVENOUS

## 2021-11-14 ENCOUNTER — Encounter: Payer: Self-pay | Admitting: Cardiology

## 2021-11-14 ENCOUNTER — Other Ambulatory Visit: Payer: Self-pay

## 2021-11-14 ENCOUNTER — Ambulatory Visit: Payer: Medicare Other | Admitting: Cardiology

## 2021-11-14 VITALS — BP 136/86 | HR 72 | Temp 98.0°F | Resp 17 | Ht 66.0 in | Wt 166.2 lb

## 2021-11-14 DIAGNOSIS — E7800 Pure hypercholesterolemia, unspecified: Secondary | ICD-10-CM | POA: Diagnosis not present

## 2021-11-14 DIAGNOSIS — R931 Abnormal findings on diagnostic imaging of heart and coronary circulation: Secondary | ICD-10-CM

## 2021-11-14 NOTE — Progress Notes (Signed)
Primary Physician/Referring:  Holland Commons, FNP  Patient ID: Haley Kirk, female    DOB: Jul 29, 1946, 76 y.o.   MRN: [id number]  Chief Complaint  Patient presents with   New Patient (Initial Visit)   cardiac ct agatston score 190   HPI:    Haley Kirk  is a 76 y.o. Caucasian female with history of vitamin D deficiency, hyperlipidemia recently started on rosuvastatin.  Patient is referred to our office by PCP for further evaluation as she underwent coronary calcium scoring with total Aggaston score of 190, placing her in the 73rd percentile.  She is asymptomatic.  Past Medical History:  Diagnosis Date   Hyperlipidemia    Scoliosis    Seasonal allergies    Past Surgical History:  Procedure Laterality Date   BREAST BIOPSY Right 20 years ago   DILATION AND CURETTAGE OF UTERUS  10/21/1978   IRRIGATION AND DEBRIDEMENT SEBACEOUS CYST  10/28/2013   pathology sent off to be tested   NASAL SEPTUM SURGERY  10/22/1971   TONSILLECTOMY  76 years of age   Family History  Problem Relation Age of Onset   Diabetes Mother    Heart disease Mother    Kidney disease Mother    Hypertension Mother    Prostate cancer Father    Diabetes Sister    Hypertension Sister    Diabetes Brother    Lymphoma Brother    Breast cancer Paternal Aunt     Social History   Tobacco Use   Smoking status: Never   Smokeless tobacco: Never  Substance Use Topics   Alcohol use: Yes    Alcohol/week: 0.0 standard drinks    Comment: 2-3 glasses of wine a month   Marital Status: Married   ROS  Review of Systems  Cardiovascular:  Negative for chest pain, dyspnea on exertion and leg swelling.  Gastrointestinal:  Negative for melena.   Objective  Blood pressure 136/86, pulse 72, temperature 98 F (36.7 C), temperature source Temporal, resp. rate 17, height 5\' 6"  (1.676 m), weight 166 lb 3.2 oz (75.4 kg), SpO2 99 %.  Vitals with BMI 11/14/2021 11/14/2021 11/24/2014  Height - 5\' 6"  5\' 6"   Weight - 166  lbs 3 oz 157 lbs  BMI - 27.51 70.0  Systolic 174 944 967  Diastolic 86 80 82  Pulse 72 72 66     Physical Exam Neck:     Vascular: No carotid bruit or JVD.  Cardiovascular:     Rate and Rhythm: Normal rate and regular rhythm.     Pulses: Intact distal pulses.     Heart sounds: Normal heart sounds. No murmur heard.   No gallop.  Pulmonary:     Effort: Pulmonary effort is normal.     Breath sounds: Normal breath sounds.  Abdominal:     General: Bowel sounds are normal.     Palpations: Abdomen is soft.  Musculoskeletal:        General: No swelling.    Laboratory examination:   External labs:  09/22/2021: Total cholesterol 201, triglycerides 149, HDL 64, LDL 107 Vitamin D 30.6 Sodium 143, potassium 5.0, BUN 18, creatinine 0.87, GFR >60   Allergies  No Known Allergies   Medications Prior to Visit:   Outpatient Medications Prior to Visit  Medication Sig Dispense Refill   Cholecalciferol 50 MCG (2000 UT) CHEW Chew 1 tablet by mouth daily.     Multiple Vitamins-Minerals (PRESERVISION AREDS 2 PO) Take 1 tablet by mouth daily.  rosuvastatin (CRESTOR) 5 MG tablet Take 5 mg by mouth daily.     Travoprost, BAK Free, (TRAVATAN) 0.004 % SOLN ophthalmic solution Place 1 drop into both eyes daily.     cetirizine (ZYRTEC) 10 MG tablet Take 10 mg by mouth daily.     esomeprazole (NEXIUM) 40 MG capsule Take 40 mg by mouth as needed.     triamcinolone (NASACORT AQ) 55 MCG/ACT AERO nasal inhaler Place 2 sprays into the nose daily. 1 Inhaler 12   No facility-administered medications prior to visit.   Final Medications at End of Visit    Current Meds  Medication Sig   Cholecalciferol 50 MCG (2000 UT) CHEW Chew 1 tablet by mouth daily.   Multiple Vitamins-Minerals (PRESERVISION AREDS 2 PO) Take 1 tablet by mouth daily.   rosuvastatin (CRESTOR) 5 MG tablet Take 5 mg by mouth daily.   Travoprost, BAK Free, (TRAVATAN) 0.004 % SOLN ophthalmic solution Place 1 drop into both eyes daily.    Radiology:   No results found.  Cardiac Studies:   CORONARY CALCIUM SCORES 10/18/2021:  Left Main: 0  LAD: 140  LCx: 0  RCA: 50  Total Agatston Score: 190  MESA database percentile: 73   AORTA MEASUREMENTS:  Ascending Aorta: 30 mm  Descending Aorta: 26 mm   IMPRESSION: 1. Coronary calcium score 190 is at the 73rd percentile for the patient's age, sex and race. 2. Moderate-sized hiatal hernia. 3. Rightward convex thoracic scoliosis.  EKG:   EKG 11/14/2021: Sinus bradycardia at rate 59 bpm with short PR interval, otherwise normal EKG.  Assessment     ICD-10-CM   1. Elevated coronary artery calcium score 10/18/21 score 190,  73rd percentile.  R93.1 EKG 12-Lead    2. Hypercholesteremia  E78.00        Medications Discontinued During This Encounter  Medication Reason   esomeprazole (NEXIUM) 40 MG capsule    cetirizine (ZYRTEC) 10 MG tablet    triamcinolone (NASACORT AQ) 55 MCG/ACT AERO nasal inhaler     No orders of the defined types were placed in this encounter.   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  She has no other significant cardiovascular risk factors that are traditional including hypertension or diabetes mellitus or tobacco use disorder.  In view of age and hyperlipidemia as the only risk factor, I have recommended that she continue with present dose of statin which is low-dose, as she has not been able to tolerate higher dose and goal LDL would be closer to 100 which is almost achieved.  She is planning to lose 5 to 10 pounds in weight and this will certainly bring her LDL to goal.  Primary prevention was discussed, advised her to increase her physical activity especially aerobic activity.  As she remains asymptomatic, physical examination is normal with a normal EKG, no cardiac examination is  indicated.  I will see her back on a as needed basis.  If additional LDL lowering this absolutely necessary, Zetia 5 to 10 mg we will certainly do this.   Adrian Prows, MD, [facility] 11/14/2021, 12:48 PM Office: [phone] Fax: [phone] Pager: [phone]

## 2021-12-06 DIAGNOSIS — U071 COVID-19: Secondary | ICD-10-CM | POA: Diagnosis not present

## 2021-12-26 DIAGNOSIS — K649 Unspecified hemorrhoids: Secondary | ICD-10-CM | POA: Diagnosis not present

## 2021-12-26 DIAGNOSIS — K594 Anal spasm: Secondary | ICD-10-CM | POA: Diagnosis not present

## 2021-12-26 DIAGNOSIS — K6289 Other specified diseases of anus and rectum: Secondary | ICD-10-CM | POA: Diagnosis not present

## 2022-05-04 IMAGING — CT CT CARDIAC CORONARY ARTERY CALCIUM SCORE
3 series · 14 of 20 positions shown, 16 images · non-contrast
Comparison: None.

CLINICAL DATA: 75-year-old Caucasian female with history of
hyperlipidemia.

EXAM:
CT CARDIAC CORONARY ARTERY CALCIUM SCORE
TECHNIQUE: Non-contrast imaging through the heart was performed using
prospective ECG gating. Image post processing was performed on an
independent workstation, allowing for quantitative analysis of the
heart and coronary arteries. Note that this exam targets the heart
and the chest was not imaged in its entirety.

[Series 2: calcium scoring 2.00 qr36 bestdiast 69% hrt calciu · axial · 0.38mm/px · z∈[+1668,+1752]mm · 4 of 70 slices shown]
[im 14/70  vessel]
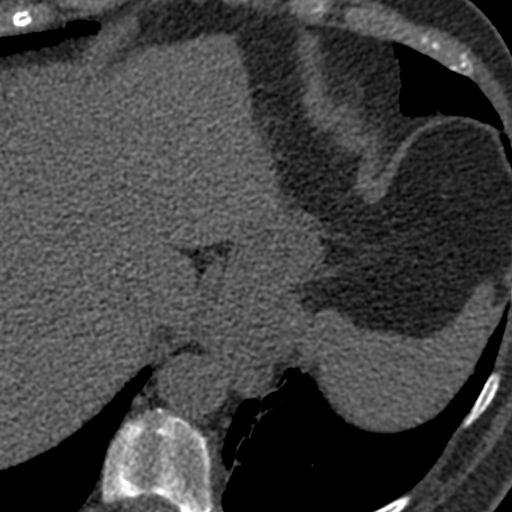
[im 28/70  vessel]
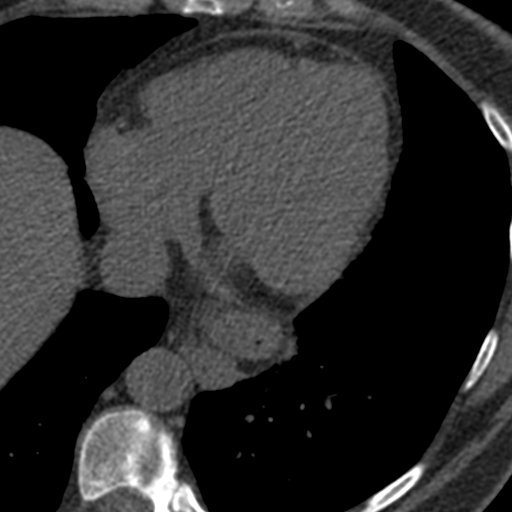
[im 42/70  vessel]
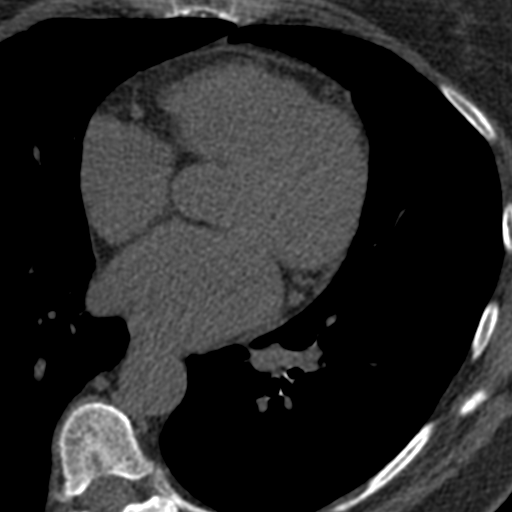
[im 56/70  vessel]
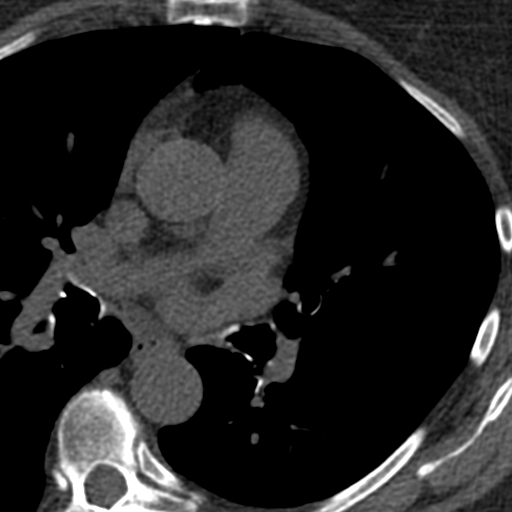

[Series 3: calcium scoring 2.00 br40 bestdiast 69% axial · axial · 0.51mm/px · z∈[+1664,+1756]mm · 5 of 70 slices shown, 7 images]
[im 12/70  vessel]
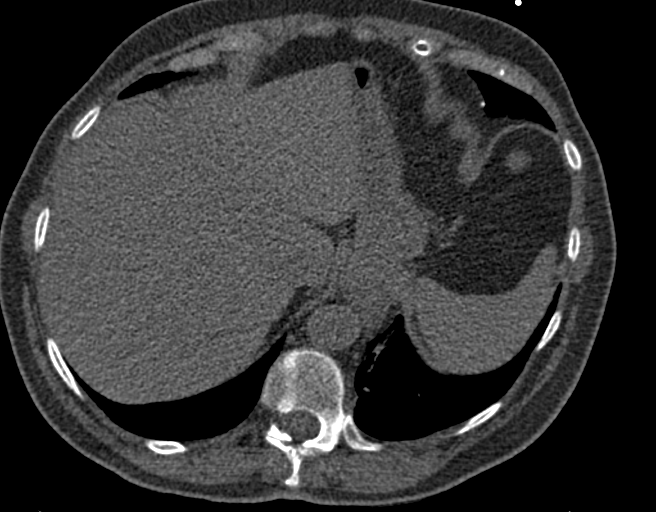
[im 12/70  lung]
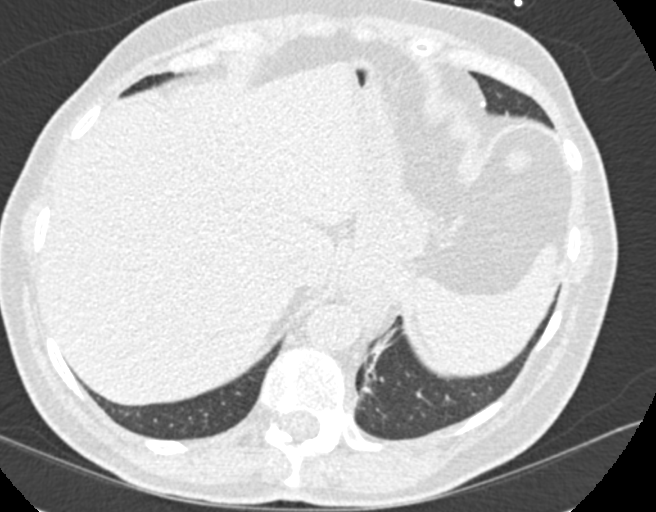
[im 24/70  vessel]
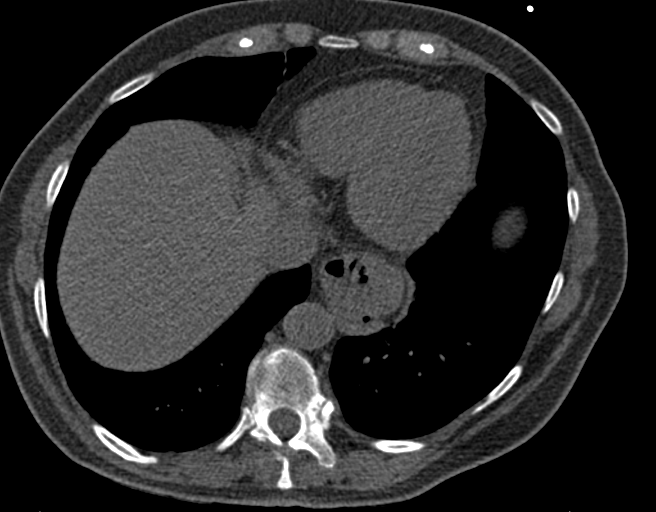
[im 35/70  vessel]
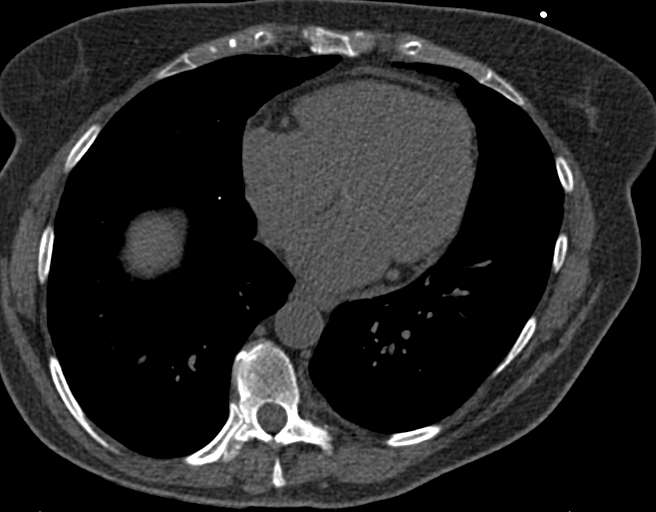
[im 47/70  vessel]
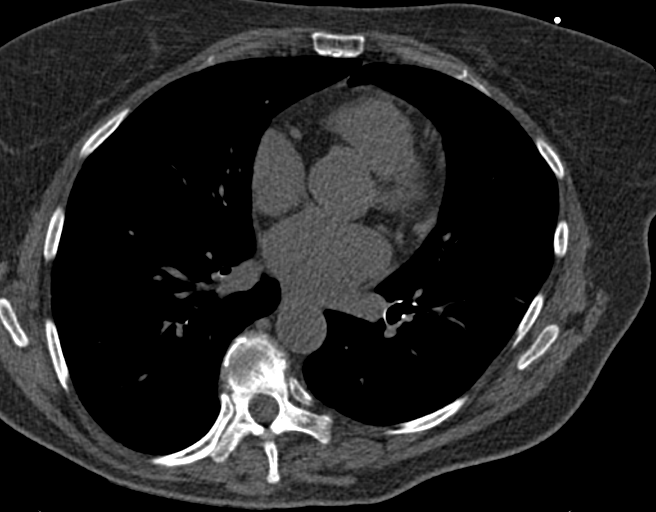
[im 58/70  vessel]
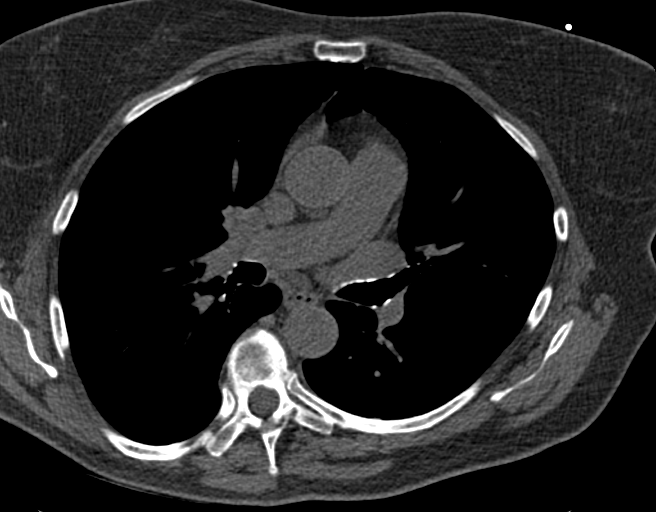
[im 58/70  lung]
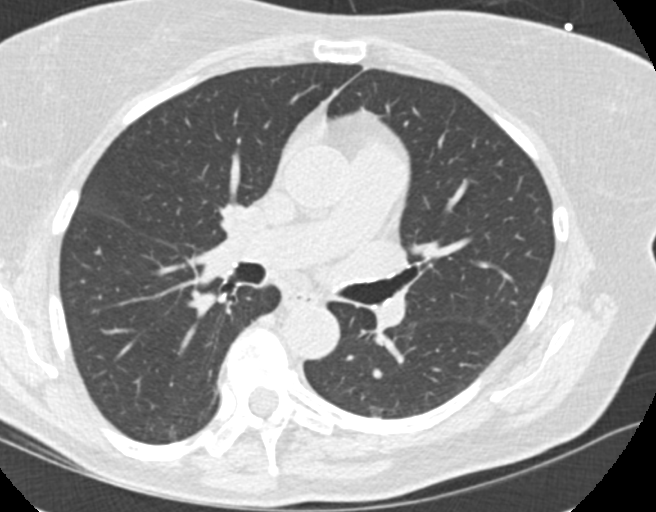

[Series 9: calcium scoring 2.00 br60 bestdiast 69% lungs · axial · 0.53mm/px · z∈[+1664,+1756]mm · 5 of 70 slices shown]
[im 12/70  vessel]
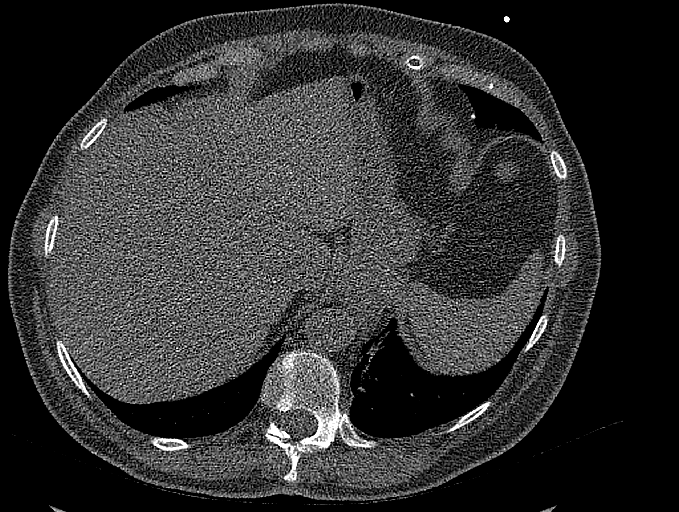
[im 24/70  vessel]
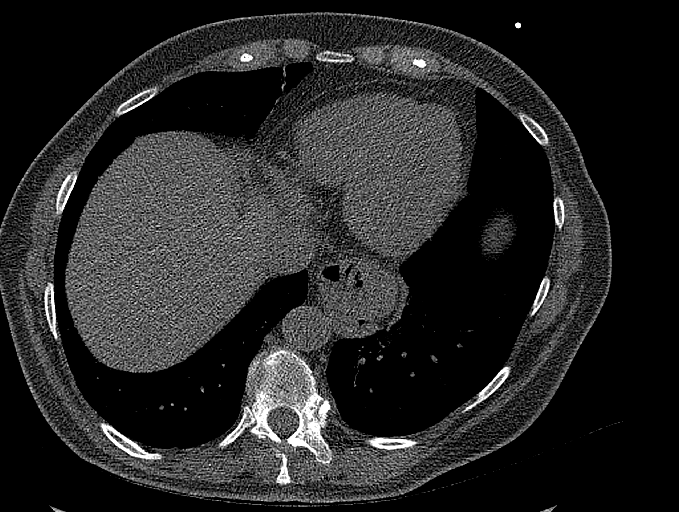
[im 35/70  vessel]
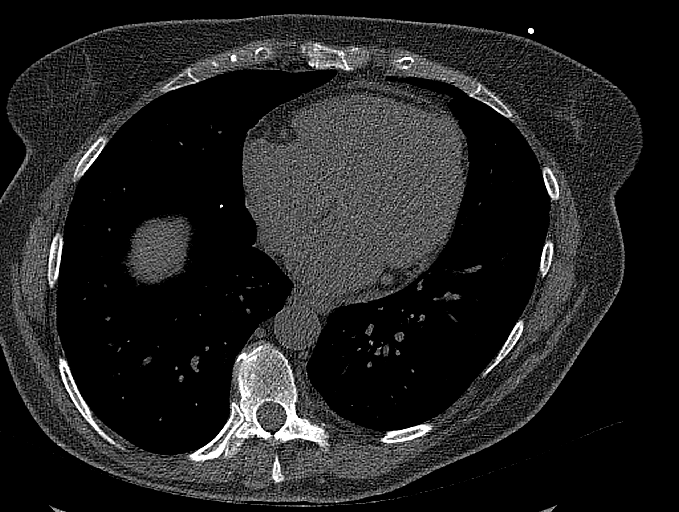
[im 47/70  vessel]
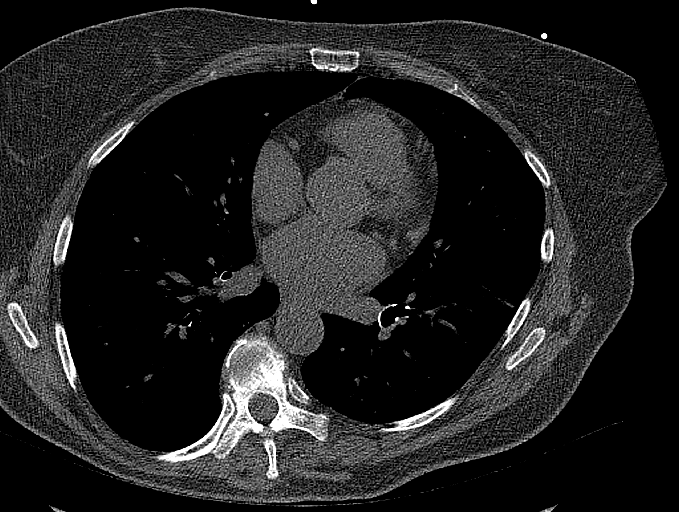
[im 58/70  vessel]
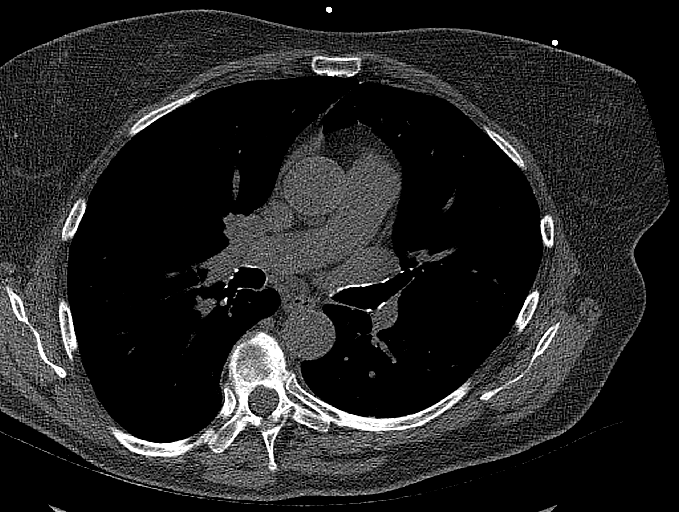

[14 of 20 positions shown; findings below may reference images not displayed]

FINDINGS: CORONARY CALCIUM SCORES:

Left Main: 0

LAD: 140

LCx: 0

RCA: 50

Total Agatston Score: 190

[HOSPITAL] percentile: 73

AORTA MEASUREMENTS:

Ascending Aorta: 30 mm

Descending Aorta: 26 mm

OTHER FINDINGS:

The heart size is within normal limits. No pericardial fluid is
identified. Visualized segments of the thoracic aorta and central
pulmonary arteries are normal in caliber. Visualized mediastinum and
hilar regions demonstrate no lymphadenopathy or masses.
Moderate-sized hiatal hernia. Visualized lungs show no evidence of
pulmonary edema, consolidation, pneumothorax, nodule or pleural
fluid. Visualized upper abdomen is unremarkable. The bony thorax
demonstrates a rightward convex scoliosis of the thoracic spine.
IMPRESSION: 1. Coronary calcium score 190 is at the 73rd percentile for the
patient's age, sex and race.
2. Moderate-sized hiatal hernia.
3. Rightward convex thoracic scoliosis.

## 2022-05-04 IMAGING — US US ABDOMEN COMPLETE
1 series · 14 of 25 positions shown · non-contrast
Comparison: 11/06/2018

CLINICAL DATA: Follow-up polyp

EXAM:
ABDOMEN ULTRASOUND COMPLETE

[Series 1: us abdomen complete · 0.15mm/px · 14 of 101 slices shown]
[im 1/101]
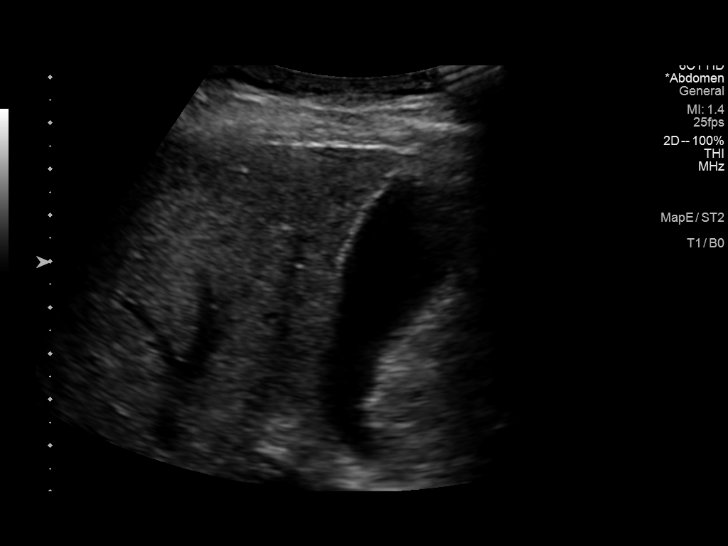
[im 9/101]
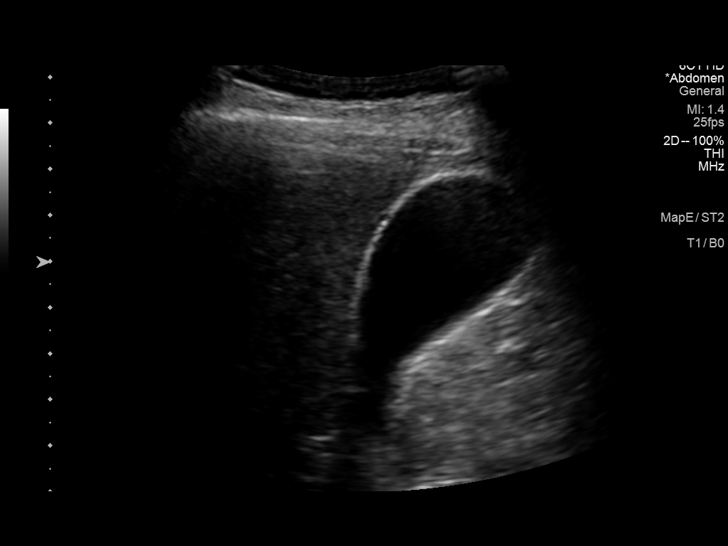
[im 17/101]
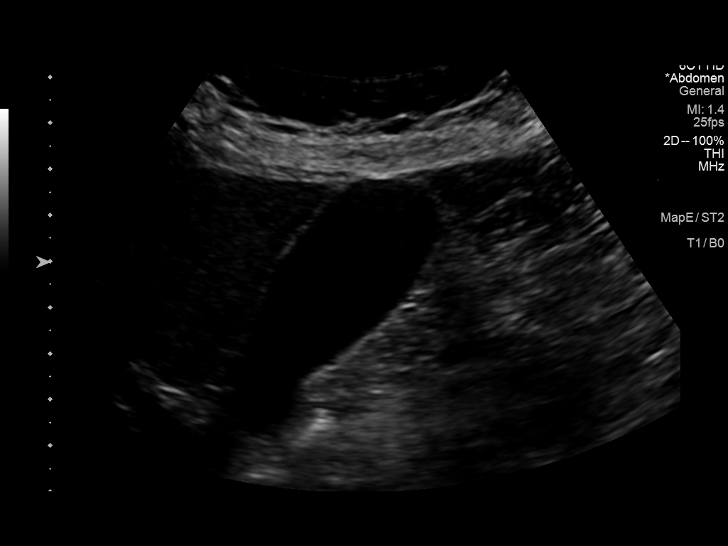
[im 26/101]
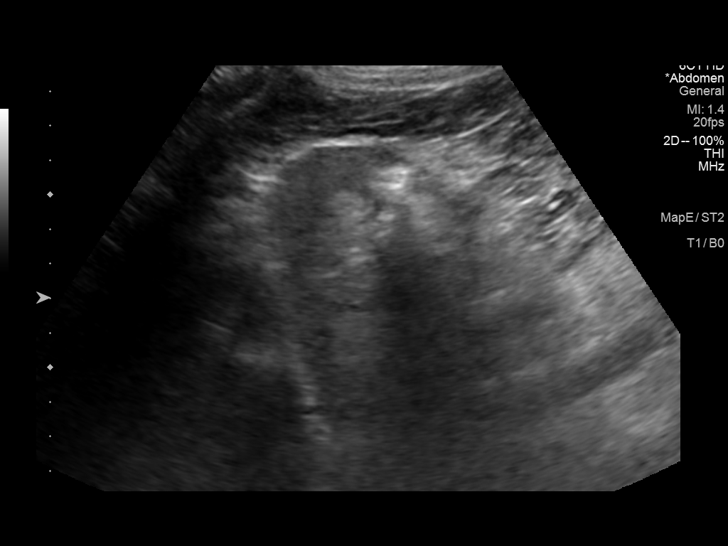
[im 34/101]
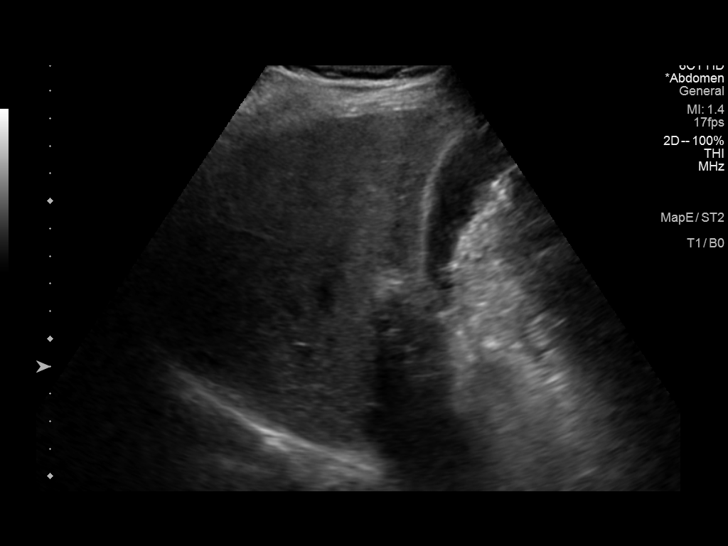
[im 38/101]
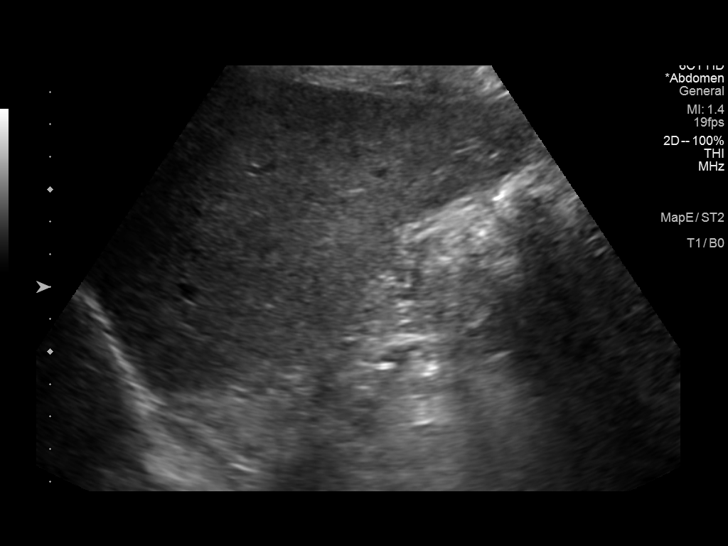
[im 46/101]
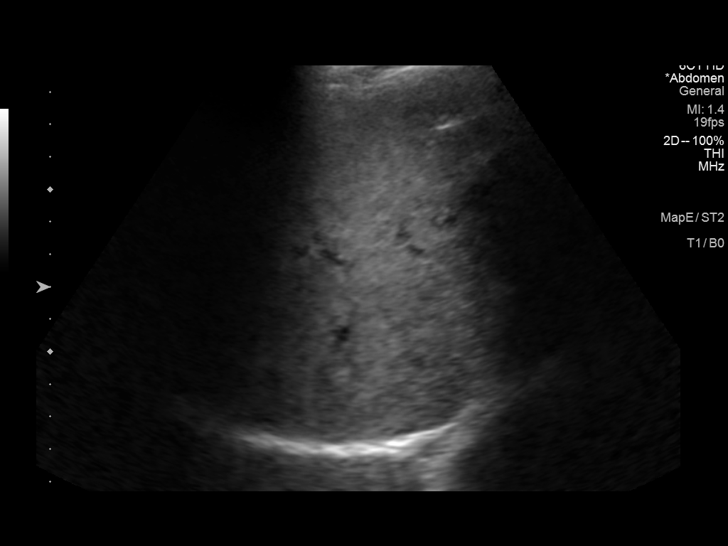
[im 55/101]
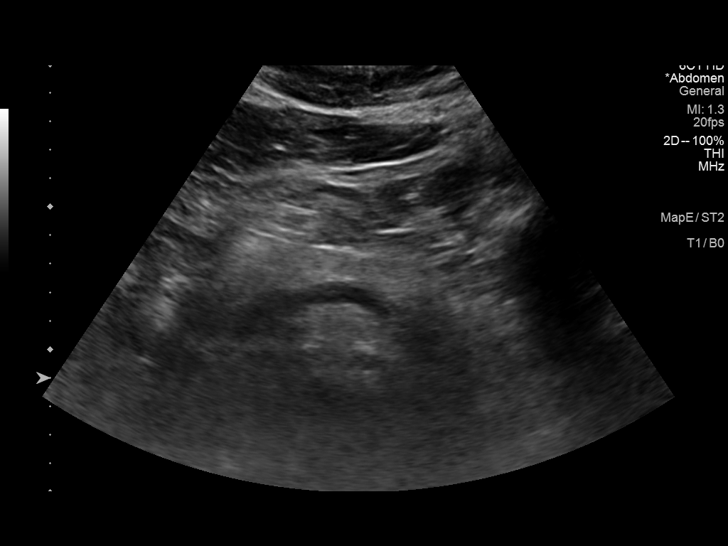
[im 63/101]
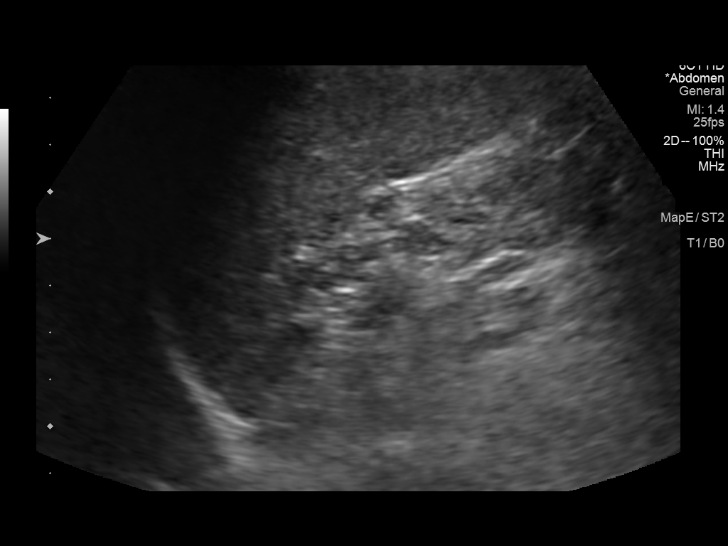
[im 67/101]
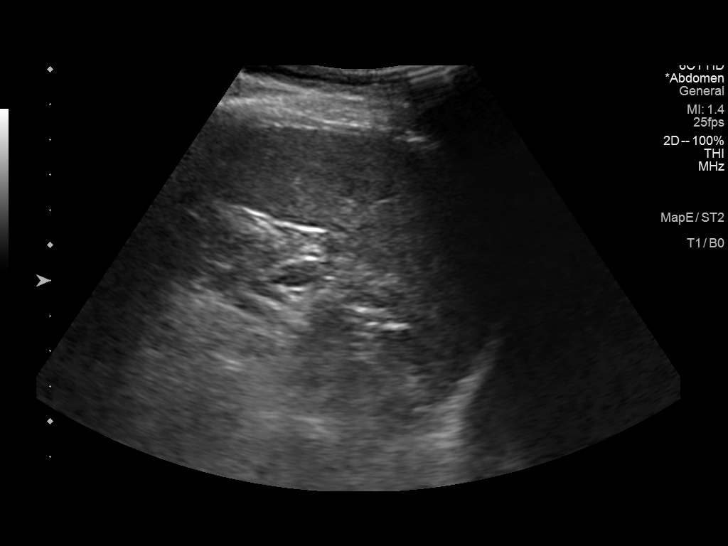
[im 76/101]
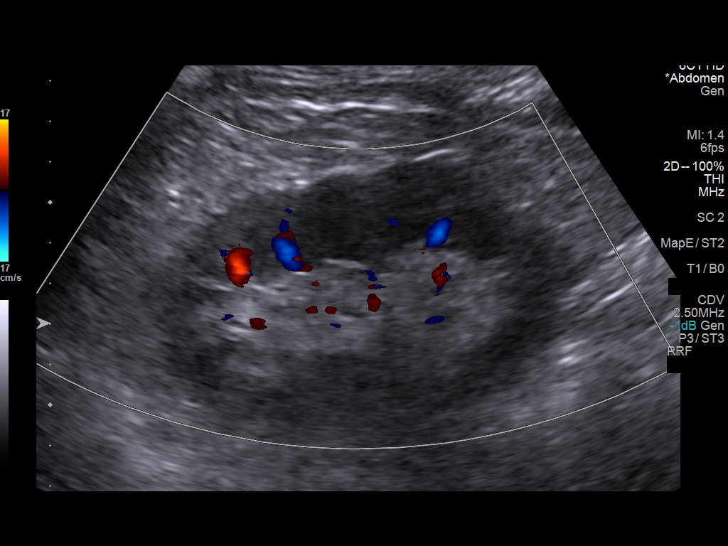
[im 84/101]
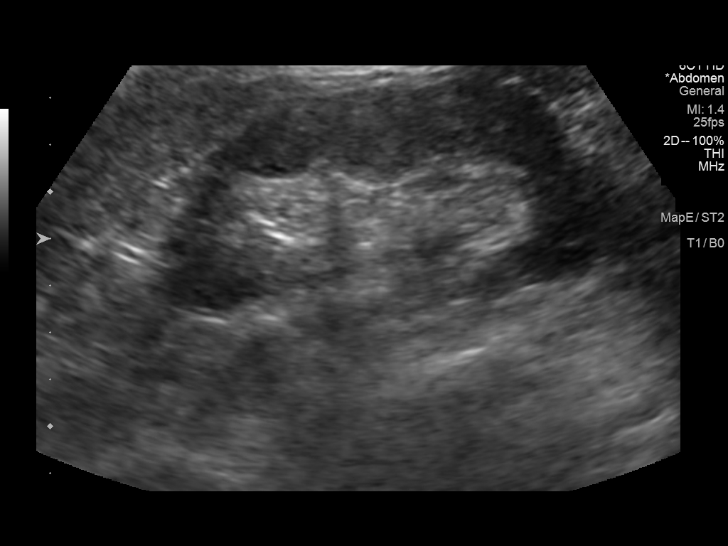
[im 92/101]
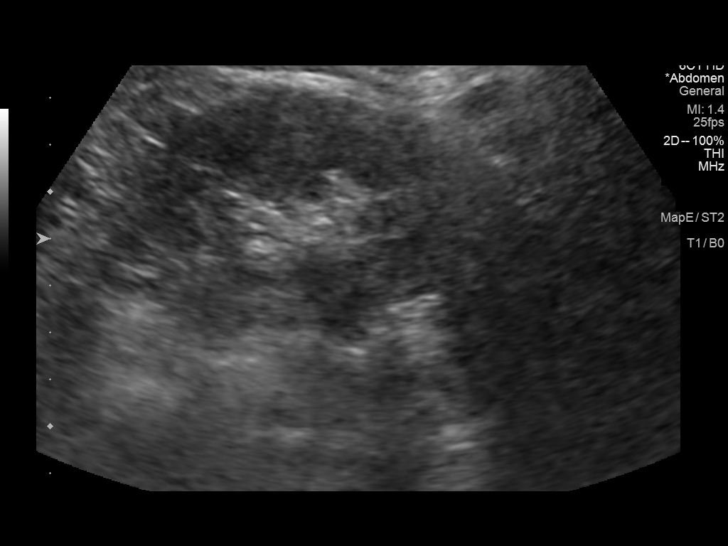
[im 101/101]
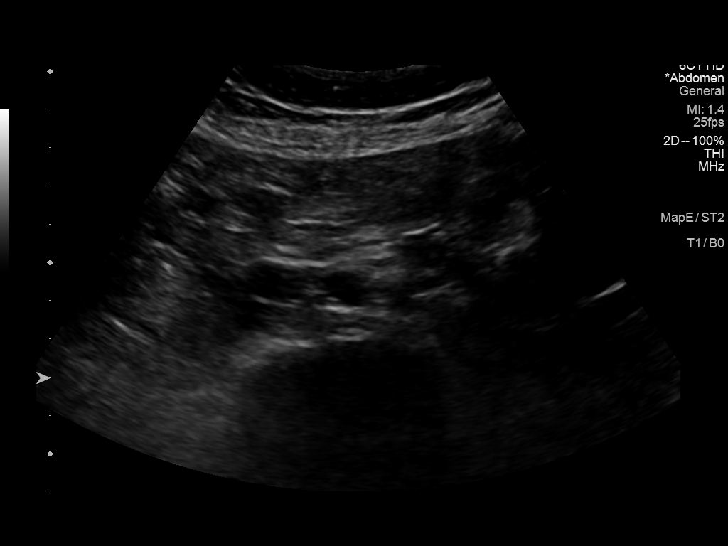

[14 of 25 positions shown; findings below may reference images not displayed]

FINDINGS: Gallbladder: No shadowing stone. Small gallbladder polyps measuring
up to 4 mm. No shadowing stone.

Common bile duct: Diameter: 2.9 mm

Liver: Diffusely echogenic. No focal hepatic abnormality. Portal
vein is patent on color Doppler imaging with normal direction of
blood flow towards the liver.

IVC: No abnormality visualized.

Pancreas: Visualized portion unremarkable.

Spleen: Size and appearance within normal limits.

Right Kidney: Length: 11.4 cm. Echogenicity within normal limits. No
mass or hydronephrosis visualized.

Left Kidney: Length: 9.5 cm. Echogenicity within normal limits. No
mass or hydronephrosis visualized.

Abdominal aorta: No aneurysm visualized.

Other findings: None.
IMPRESSION: 1. Similar small gallbladder polyps measuring up to 4 mm, no
specific follow-up imaging is recommended
2. Echogenic liver consistent with hepatic steatosis and or
hepatocellular disease

## 2022-05-21 DIAGNOSIS — H25812 Combined forms of age-related cataract, left eye: Secondary | ICD-10-CM | POA: Diagnosis not present

## 2022-05-21 DIAGNOSIS — H2511 Age-related nuclear cataract, right eye: Secondary | ICD-10-CM | POA: Diagnosis not present

## 2022-06-17 DIAGNOSIS — H25812 Combined forms of age-related cataract, left eye: Secondary | ICD-10-CM | POA: Diagnosis not present

## 2022-06-17 DIAGNOSIS — Z01818 Encounter for other preprocedural examination: Secondary | ICD-10-CM | POA: Diagnosis not present

## 2022-06-17 DIAGNOSIS — H2511 Age-related nuclear cataract, right eye: Secondary | ICD-10-CM | POA: Diagnosis not present

## 2022-07-08 ENCOUNTER — Other Ambulatory Visit: Payer: Self-pay | Admitting: Internal Medicine

## 2022-07-08 DIAGNOSIS — Z1231 Encounter for screening mammogram for malignant neoplasm of breast: Secondary | ICD-10-CM

## 2022-07-10 DIAGNOSIS — H25812 Combined forms of age-related cataract, left eye: Secondary | ICD-10-CM | POA: Diagnosis not present

## 2022-07-10 DIAGNOSIS — H269 Unspecified cataract: Secondary | ICD-10-CM | POA: Diagnosis not present

## 2022-07-22 DIAGNOSIS — E538 Deficiency of other specified B group vitamins: Secondary | ICD-10-CM | POA: Diagnosis not present

## 2022-07-22 DIAGNOSIS — R739 Hyperglycemia, unspecified: Secondary | ICD-10-CM | POA: Diagnosis not present

## 2022-07-22 DIAGNOSIS — Z0000 Encounter for general adult medical examination without abnormal findings: Secondary | ICD-10-CM | POA: Diagnosis not present

## 2022-07-22 DIAGNOSIS — E559 Vitamin D deficiency, unspecified: Secondary | ICD-10-CM | POA: Diagnosis not present

## 2022-07-22 DIAGNOSIS — E7800 Pure hypercholesterolemia, unspecified: Secondary | ICD-10-CM | POA: Diagnosis not present

## 2022-07-29 DIAGNOSIS — Z0000 Encounter for general adult medical examination without abnormal findings: Secondary | ICD-10-CM | POA: Diagnosis not present

## 2022-07-29 DIAGNOSIS — Z23 Encounter for immunization: Secondary | ICD-10-CM | POA: Diagnosis not present

## 2022-07-29 DIAGNOSIS — K824 Cholesterolosis of gallbladder: Secondary | ICD-10-CM | POA: Diagnosis not present

## 2022-07-29 DIAGNOSIS — Z780 Asymptomatic menopausal state: Secondary | ICD-10-CM | POA: Diagnosis not present

## 2022-07-29 DIAGNOSIS — I2510 Atherosclerotic heart disease of native coronary artery without angina pectoris: Secondary | ICD-10-CM | POA: Diagnosis not present

## 2022-07-29 DIAGNOSIS — E559 Vitamin D deficiency, unspecified: Secondary | ICD-10-CM | POA: Diagnosis not present

## 2022-07-29 DIAGNOSIS — D329 Benign neoplasm of meninges, unspecified: Secondary | ICD-10-CM | POA: Diagnosis not present

## 2022-07-29 DIAGNOSIS — E7800 Pure hypercholesterolemia, unspecified: Secondary | ICD-10-CM | POA: Diagnosis not present

## 2022-07-30 ENCOUNTER — Ambulatory Visit: Payer: No Typology Code available for payment source

## 2022-08-01 ENCOUNTER — Ambulatory Visit
Admission: RE | Admit: 2022-08-01 | Discharge: 2022-08-01 | Disposition: A | Discharge status: Home / Self Care | Payer: Medicare Other | Source: Ambulatory Visit | Attending: Internal Medicine | Admitting: Internal Medicine

## 2022-08-01 DIAGNOSIS — Z1231 Encounter for screening mammogram for malignant neoplasm of breast: Secondary | ICD-10-CM

## 2022-08-19 DIAGNOSIS — L578 Other skin changes due to chronic exposure to nonionizing radiation: Secondary | ICD-10-CM | POA: Diagnosis not present

## 2022-08-19 DIAGNOSIS — L821 Other seborrheic keratosis: Secondary | ICD-10-CM | POA: Diagnosis not present

## 2022-08-19 DIAGNOSIS — L814 Other melanin hyperpigmentation: Secondary | ICD-10-CM | POA: Diagnosis not present

## 2022-08-23 ENCOUNTER — Ambulatory Visit: Payer: No Typology Code available for payment source

## 2022-09-18 DIAGNOSIS — Z23 Encounter for immunization: Secondary | ICD-10-CM | POA: Diagnosis not present

## 2022-10-29 DIAGNOSIS — H2511 Age-related nuclear cataract, right eye: Secondary | ICD-10-CM | POA: Diagnosis not present

## 2023-01-01 DIAGNOSIS — H2511 Age-related nuclear cataract, right eye: Secondary | ICD-10-CM | POA: Diagnosis not present

## 2023-01-01 DIAGNOSIS — H269 Unspecified cataract: Secondary | ICD-10-CM | POA: Diagnosis not present

## 2023-02-24 ENCOUNTER — Other Ambulatory Visit: Payer: Self-pay | Admitting: General Surgery

## 2023-02-24 DIAGNOSIS — R222 Localized swelling, mass and lump, trunk: Secondary | ICD-10-CM | POA: Diagnosis not present

## 2023-07-15 DIAGNOSIS — H26493 Other secondary cataract, bilateral: Secondary | ICD-10-CM | POA: Diagnosis not present

## 2023-07-15 DIAGNOSIS — H401131 Primary open-angle glaucoma, bilateral, mild stage: Secondary | ICD-10-CM | POA: Diagnosis not present

## 2023-08-21 DIAGNOSIS — L578 Other skin changes due to chronic exposure to nonionizing radiation: Secondary | ICD-10-CM | POA: Diagnosis not present

## 2023-08-21 DIAGNOSIS — L821 Other seborrheic keratosis: Secondary | ICD-10-CM | POA: Diagnosis not present

## 2023-08-21 DIAGNOSIS — Z9889 Other specified postprocedural states: Secondary | ICD-10-CM | POA: Diagnosis not present

## 2023-08-21 DIAGNOSIS — L814 Other melanin hyperpigmentation: Secondary | ICD-10-CM | POA: Diagnosis not present

## 2023-08-25 DIAGNOSIS — E538 Deficiency of other specified B group vitamins: Secondary | ICD-10-CM | POA: Diagnosis not present

## 2023-08-25 DIAGNOSIS — I2510 Atherosclerotic heart disease of native coronary artery without angina pectoris: Secondary | ICD-10-CM | POA: Diagnosis not present

## 2023-08-25 DIAGNOSIS — E7800 Pure hypercholesterolemia, unspecified: Secondary | ICD-10-CM | POA: Diagnosis not present

## 2023-08-25 DIAGNOSIS — E559 Vitamin D deficiency, unspecified: Secondary | ICD-10-CM | POA: Diagnosis not present

## 2023-08-25 DIAGNOSIS — R739 Hyperglycemia, unspecified: Secondary | ICD-10-CM | POA: Diagnosis not present

## 2023-08-26 LAB — LAB REPORT - SCANNED
A1c: 5.9
EGFR: 76

## 2023-08-28 ENCOUNTER — Other Ambulatory Visit: Payer: Self-pay | Admitting: Registered Nurse

## 2023-08-28 ENCOUNTER — Ambulatory Visit
Admission: RE | Admit: 2023-08-28 | Discharge: 2023-08-28 | Disposition: A | Discharge status: Home / Self Care | Payer: Medicare Other | Source: Ambulatory Visit | Attending: Registered Nurse | Admitting: Registered Nurse

## 2023-08-28 DIAGNOSIS — Z1231 Encounter for screening mammogram for malignant neoplasm of breast: Secondary | ICD-10-CM

## 2023-09-01 DIAGNOSIS — Z0000 Encounter for general adult medical examination without abnormal findings: Secondary | ICD-10-CM | POA: Diagnosis not present

## 2023-09-01 DIAGNOSIS — E559 Vitamin D deficiency, unspecified: Secondary | ICD-10-CM | POA: Diagnosis not present

## 2023-09-01 DIAGNOSIS — R7303 Prediabetes: Secondary | ICD-10-CM | POA: Diagnosis not present

## 2023-09-01 DIAGNOSIS — E7800 Pure hypercholesterolemia, unspecified: Secondary | ICD-10-CM | POA: Diagnosis not present

## 2023-09-01 DIAGNOSIS — I2510 Atherosclerotic heart disease of native coronary artery without angina pectoris: Secondary | ICD-10-CM | POA: Diagnosis not present

## 2023-09-01 DIAGNOSIS — Z23 Encounter for immunization: Secondary | ICD-10-CM | POA: Diagnosis not present

## 2023-09-03 ENCOUNTER — Other Ambulatory Visit: Payer: Self-pay | Admitting: Registered Nurse

## 2023-09-03 DIAGNOSIS — R928 Other abnormal and inconclusive findings on diagnostic imaging of breast: Secondary | ICD-10-CM

## 2023-09-13 ENCOUNTER — Ambulatory Visit
Admission: RE | Admit: 2023-09-13 | Discharge: 2023-09-13 | Disposition: A | Discharge status: Home / Self Care | Payer: Medicare Other | Source: Ambulatory Visit | Attending: Registered Nurse | Admitting: Registered Nurse

## 2023-09-13 ENCOUNTER — Ambulatory Visit: Payer: Medicare Other

## 2023-09-13 DIAGNOSIS — R928 Other abnormal and inconclusive findings on diagnostic imaging of breast: Secondary | ICD-10-CM

## 2023-09-13 DIAGNOSIS — N6310 Unspecified lump in the right breast, unspecified quadrant: Secondary | ICD-10-CM | POA: Diagnosis not present

## 2023-09-13 DIAGNOSIS — N6312 Unspecified lump in the right breast, upper inner quadrant: Secondary | ICD-10-CM | POA: Diagnosis not present

## 2024-01-13 DIAGNOSIS — H26493 Other secondary cataract, bilateral: Secondary | ICD-10-CM | POA: Diagnosis not present

## 2024-01-13 DIAGNOSIS — H401131 Primary open-angle glaucoma, bilateral, mild stage: Secondary | ICD-10-CM | POA: Diagnosis not present

## 2024-08-24 DIAGNOSIS — H019 Unspecified inflammation of eyelid: Secondary | ICD-10-CM | POA: Diagnosis not present

## 2024-08-24 DIAGNOSIS — D225 Melanocytic nevi of trunk: Secondary | ICD-10-CM | POA: Diagnosis not present

## 2024-08-24 DIAGNOSIS — L821 Other seborrheic keratosis: Secondary | ICD-10-CM | POA: Diagnosis not present

## 2024-08-24 DIAGNOSIS — L578 Other skin changes due to chronic exposure to nonionizing radiation: Secondary | ICD-10-CM | POA: Diagnosis not present

## 2024-08-24 DIAGNOSIS — D2272 Melanocytic nevi of left lower limb, including hip: Secondary | ICD-10-CM | POA: Diagnosis not present

## 2024-08-24 DIAGNOSIS — L720 Epidermal cyst: Secondary | ICD-10-CM | POA: Diagnosis not present

## 2024-08-24 DIAGNOSIS — L814 Other melanin hyperpigmentation: Secondary | ICD-10-CM | POA: Diagnosis not present
